# Patient Record
Sex: Female | Born: 1990 | Race: White | Hispanic: No | Marital: Single | Smoking: Never smoker
Health system: Southern US, Community
[De-identification: ages and names within clinical notes are randomized; demographics above are authoritative.]

## PROBLEM LIST (undated history)

## (undated) DIAGNOSIS — F419 Anxiety disorder, unspecified: Secondary | ICD-10-CM

## (undated) DIAGNOSIS — F988 Other specified behavioral and emotional disorders with onset usually occurring in childhood and adolescence: Secondary | ICD-10-CM

## (undated) DIAGNOSIS — Z8481 Family history of carrier of genetic disease: Secondary | ICD-10-CM

## (undated) DIAGNOSIS — B009 Herpesviral infection, unspecified: Secondary | ICD-10-CM

## (undated) DIAGNOSIS — F329 Major depressive disorder, single episode, unspecified: Secondary | ICD-10-CM

## (undated) DIAGNOSIS — Z803 Family history of malignant neoplasm of breast: Secondary | ICD-10-CM

## (undated) DIAGNOSIS — J45909 Unspecified asthma, uncomplicated: Secondary | ICD-10-CM

## (undated) DIAGNOSIS — G43019 Migraine without aura, intractable, without status migrainosus: Secondary | ICD-10-CM

## (undated) DIAGNOSIS — T7840XA Allergy, unspecified, initial encounter: Secondary | ICD-10-CM

## (undated) DIAGNOSIS — F32A Depression, unspecified: Secondary | ICD-10-CM

## (undated) DIAGNOSIS — K219 Gastro-esophageal reflux disease without esophagitis: Secondary | ICD-10-CM

## (undated) HISTORY — DX: Family history of malignant neoplasm of breast: Z80.3

## (undated) HISTORY — DX: Depression, unspecified: F32.A

## (undated) HISTORY — DX: Family history of carrier of genetic disease: Z84.81

## (undated) HISTORY — DX: Major depressive disorder, single episode, unspecified: F32.9

## (undated) HISTORY — PX: TONSILLECTOMY: SUR1361

## (undated) HISTORY — DX: Other specified behavioral and emotional disorders with onset usually occurring in childhood and adolescence: F98.8

## (undated) HISTORY — DX: Anxiety disorder, unspecified: F41.9

## (undated) HISTORY — DX: Herpesviral infection, unspecified: B00.9

## (undated) HISTORY — DX: Migraine without aura, intractable, without status migrainosus: G43.019

## (undated) HISTORY — PX: BREAST RECONSTRUCTION: SHX9

## (undated) HISTORY — PX: NIPPLE SPARING MASTECTOMY: SHX6537

## (undated) HISTORY — PX: OTHER SURGICAL HISTORY: SHX169

## (undated) HISTORY — DX: Allergy, unspecified, initial encounter: T78.40XA

## (undated) HISTORY — PX: WISDOM TOOTH EXTRACTION: SHX21

## (undated) HISTORY — DX: Unspecified asthma, uncomplicated: J45.909

## (undated) HISTORY — DX: Gastro-esophageal reflux disease without esophagitis: K21.9

---

## 2017-09-27 ENCOUNTER — Ambulatory Visit: Payer: BLUE CROSS/BLUE SHIELD | Admitting: Neurology

## 2017-09-27 ENCOUNTER — Encounter: Payer: Self-pay | Admitting: Neurology

## 2017-09-27 VITALS — BP 135/90 | HR 94 | Ht 66.0 in | Wt 194.0 lb

## 2017-09-27 DIAGNOSIS — R42 Dizziness and giddiness: Secondary | ICD-10-CM

## 2017-09-27 DIAGNOSIS — G43019 Migraine without aura, intractable, without status migrainosus: Secondary | ICD-10-CM | POA: Diagnosis not present

## 2017-09-27 DIAGNOSIS — H81399 Other peripheral vertigo, unspecified ear: Secondary | ICD-10-CM | POA: Diagnosis not present

## 2017-09-27 DIAGNOSIS — Z5181 Encounter for therapeutic drug level monitoring: Secondary | ICD-10-CM

## 2017-09-27 HISTORY — DX: Migraine without aura, intractable, without status migrainosus: G43.019

## 2017-09-27 MED ORDER — TOPIRAMATE 25 MG PO TABS: ORAL_TABLET | ORAL | 3 refills | Status: DC

## 2017-09-27 MED ORDER — PREDNISONE 5 MG PO TABS: ORAL_TABLET | ORAL | 0 refills | Status: DC

## 2017-09-27 NOTE — Patient Instructions (Signed)
   We will get MRI of the brain and start a 6 day course of prednisone, start Topamax for migraine.  Topamax (topiramate) is a seizure medication that has an FDA approval for seizures and for migraine headache. Potential side effects of this medication include weight loss, cognitive slowing, tingling in the fingers and toes, and carbonated drinks will taste bad. If any significant side effects are noted on this drug, please contact our office.

## 2017-09-27 NOTE — Progress Notes (Signed)
Reason for visit: Vertigo  Referring physician: Dr. Luevenia Maxin is a 27 y.o. female  History of present illness:  Ms. Bodie is a 27 year old right-handed white female with a history of migraine headache.  The patient typically gets 1 or 2 headaches a week in the frontal areas, sometimes they are in the occipital regions.  The patient may have photophobia and phonophobia with the headache.  The patient at times may have some dizziness as well associated with the migraine.  In early November 2018 the patient had a relatively sudden onset of vertigo that was associated with some nausea without vomiting.  The patient indicates that the vertigo has improved to some degree, but she remains dizzy on a daily basis.  She does not take meclizine for this.  She was seen through ENT and she was not found to have any evidence of vestibular dysfunction she claims.  The patient has not had any ear pain or change in hearing.  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  The patient over the last 6-8 weeks has developed some problems with concentration, she has word finding problems and she has had some clumsiness with the hands, she is dropping things frequently.  The patient does have some slight gait instability, but no falls with the dizziness.  She denies any double vision, slurred speech, or difficulty with swallowing.  She has had no visual loss.  She reports no numbness on the arms or legs with exception of some slight tingling in the hands at times.  The patient denies issues controlling the bowels or the bladder.  She has had blood work to include a hemoglobin A1c and a TSH that were unremarkable.  She is sent to this office for further evaluation.  She has not undergone MRI evaluation of the brain.  Past Medical History:  Diagnosis Date  . Anxiety   . Depression     Past Surgical History:  Procedure Laterality Date  . TONSILLECTOMY      Family History  Problem Relation Age of  Onset  . Migraines Father     Social history:  reports that  has never smoked. she has never used smokeless tobacco. She reports that she drinks alcohol. She reports that she does not use drugs.  Medications:  Prior to Admission medications   Medication Sig Start Date End Date Taking? Authorizing Provider  buPROPion (WELLBUTRIN XL) 150 MG 24 hr tablet Take 150 mg by mouth daily.   Yes [provider]     No Known Allergies  ROS:  Out of a complete 14 system review of symptoms, the patient complains only of the following symptoms, and all other reviewed systems are negative.  Ringing in the ears, dizziness Confusion, headache, weakness Depression, anxiety, decreased energy, disinterest in activities Insomnia  Blood pressure 135/90, pulse 94, height 5\' 6"  (1.676 m), weight 194 lb (88 kg).  Physical Exam  General: The patient is alert and cooperative at the time of the examination.  The patient is minimally obese.  Eyes: Pupils are equal, round, and reactive to light. Discs are flat bilaterally.  Ears: Tympanic membranes are partially obscured from cerumen, the thing that membranes appear to be clear.  Neck: The neck is supple, no carotid bruits are noted.  Respiratory: The respiratory examination is clear.  Cardiovascular: The cardiovascular examination reveals a regular rate and rhythm, no obvious murmurs or rubs are noted.  Skin: Extremities are without significant edema.  Neurologic Exam  Mental status: The patient is alert and oriented x 3 at the time of the examination. The patient has apparent normal recent and remote memory, with an apparently normal attention span and concentration ability.  Cranial nerves: Facial symmetry is present. There is good sensation of the face to pinprick and soft touch bilaterally. The strength of the facial muscles and the muscles to head turning and shoulder shrug are normal bilaterally. Speech is well enunciated, no aphasia or  dysarthria is noted. Extraocular movements are full. Visual fields are full. The tongue is midline, and the patient has symmetric elevation of the soft palate. No obvious hearing deficits are noted.  Motor: The motor testing reveals 5 over 5 strength of all 4 extremities. Good symmetric motor tone is noted throughout.  Sensory: Sensory testing is intact to pinprick, soft touch, vibration sensation, and position sense on all 4 extremities. No evidence of extinction is noted.  Coordination: Cerebellar testing reveals good finger-nose-finger and heel-to-shin bilaterally.  Gait and station: Gait is normal. Tandem gait is normal. Romberg is negative. No drift is seen.  Reflexes: Deep tendon reflexes are symmetric and normal bilaterally. Toes are downgoing bilaterally.   Assessment/Plan:  1.  Migraine headache  2.  Dizziness, vertigo  The patient does have a history of migraine headache, she has had some persistence of dizziness and vertigo since onset in early November.  She has developed some issues with concentration, and some problems with word finding.  We will check MRI of the brain, she will have blood work done today.  The patient may have migraine headache as an explanation for the dizziness and altered mental status.  She will be given a 6-day prednisone Dosepak, she will be started on Topamax.  She will follow-up in 3 months.  Marlan Palau. Keith Willis MD 09/27/2017 8:32 AM  [facility] [address] [place], [place] [id number]  Phone [phone] Fax [phone]

## 2017-09-28 ENCOUNTER — Telehealth: Payer: Self-pay | Admitting: *Deleted

## 2017-09-28 LAB — COMPREHENSIVE METABOLIC PANEL
ALK PHOS: 63 IU/L (ref 39–117)
ALT: 11 IU/L (ref 0–32)
AST: 17 IU/L (ref 0–40)
Albumin/Globulin Ratio: 1.8 (ref 1.2–2.2)
Albumin: 4.4 g/dL (ref 3.5–5.5)
BUN/Creatinine Ratio: 16 (ref 9–23)
BUN: 13 mg/dL (ref 6–20)
Bilirubin Total: 0.5 mg/dL (ref 0.0–1.2)
CALCIUM: 9.3 mg/dL (ref 8.7–10.2)
CO2: 20 mmol/L (ref 20–29)
CREATININE: 0.79 mg/dL (ref 0.57–1.00)
Chloride: 107 mmol/L — ABNORMAL HIGH (ref 96–106)
GFR calc Af Amer: 121 mL/min/{1.73_m2} (ref >59)
GFR, EST NON AFRICAN AMERICAN: 105 mL/min/{1.73_m2} (ref >59)
GLUCOSE: 92 mg/dL (ref 65–99)
Globulin, Total: 2.5 g/dL (ref 1.5–4.5)
Potassium: 4.1 mmol/L (ref 3.5–5.2)
Sodium: 143 mmol/L (ref 134–144)
Total Protein: 6.9 g/dL (ref 6.0–8.5)

## 2017-09-28 LAB — SEDIMENTATION RATE: SED RATE: 2 mm/h (ref 0–32)

## 2017-09-28 NOTE — Telephone Encounter (Signed)
-----   Message from York Spanielharles K Willis, MD sent at 09/28/2017  7:38 AM EST -----  The blood work results are unremarkable, with exception of a minimally elevated chloride level, not clinically significant. Please call the patient. ----- Message ----- From: Nell RangeInterface, [facility] Lab Results In Sent: 09/28/2017   5:42 AM To: York Spanielharles K Willis, MD

## 2017-09-28 NOTE — Telephone Encounter (Signed)
Called and spoke with patient about lab results per CW,MD note. Patient verbalized understanding.

## 2017-10-02 ENCOUNTER — Ambulatory Visit: Payer: BLUE CROSS/BLUE SHIELD

## 2017-10-02 DIAGNOSIS — G43019 Migraine without aura, intractable, without status migrainosus: Secondary | ICD-10-CM

## 2017-10-02 DIAGNOSIS — H81399 Other peripheral vertigo, unspecified ear: Secondary | ICD-10-CM | POA: Diagnosis not present

## 2017-10-03 ENCOUNTER — Telehealth: Payer: Self-pay | Admitting: Neurology

## 2017-10-03 MED ORDER — GADOPENTETATE DIMEGLUMINE 469.01 MG/ML IV SOLN: 18.0000 mL | Freq: Once | INTRAVENOUS | Status: DC | PRN

## 2017-10-03 NOTE — Telephone Encounter (Signed)
I called the patient.  The MRI of the brain is completely normal.  We will continue the treatment with Topamax to see if we can gain benefit with the headaches and the dizziness.   MRI brain 10/03/17:  IMPRESSION:   Normal MRI brain (with and without).

## 2017-10-10 ENCOUNTER — Telehealth: Payer: Self-pay | Admitting: Neurology

## 2017-10-10 NOTE — Telephone Encounter (Signed)
I called the patient, left a message.  The patient is having cognitive side effects on the Topamax, may need to try another medication, I will call back later.

## 2017-10-10 NOTE — Telephone Encounter (Signed)
Pt has called re: topiramate (TOPAMAX) 25 MG tablet she is experiencing cognitive slowness and her being a IT consultantgrad student and graduating in 2 months pt states this is unacceptable.  Please call

## 2017-10-11 MED ORDER — GABAPENTIN 100 MG PO CAPS: ORAL_CAPSULE | ORAL | 3 refills | Status: DC

## 2017-10-11 NOTE — Addendum Note (Signed)
Addended by: York SpanielWILLIS, CHARLES K on: 10/11/2017 07:58 AM   Modules accepted: Orders

## 2017-10-11 NOTE — Telephone Encounter (Signed)
I called the patient.  Patient cannot tolerate Topamax secondary to cognitive side effects.  She will discontinue the Topamax, she is only on 50 mg at night.  The patient has significant depression, cannot use propranolol, she is already on Wellbutrin.  The patient will be placed on low-dose gabapentin taking 100 mg in the morning, 200 mg in the evening, she will call for any dose adjustments.

## 2017-12-19 ENCOUNTER — Other Ambulatory Visit: Payer: Self-pay | Admitting: Neurology

## 2017-12-25 ENCOUNTER — Encounter: Payer: Self-pay | Admitting: Neurology

## 2017-12-25 ENCOUNTER — Ambulatory Visit: Payer: BLUE CROSS/BLUE SHIELD | Admitting: Neurology

## 2017-12-25 ENCOUNTER — Telehealth: Payer: Self-pay | Admitting: Neurology

## 2017-12-25 VITALS — BP 111/78 | HR 69 | Ht 66.0 in | Wt 193.5 lb

## 2017-12-25 DIAGNOSIS — G43019 Migraine without aura, intractable, without status migrainosus: Secondary | ICD-10-CM

## 2017-12-25 MED ORDER — VENLAFAXINE HCL ER 37.5 MG PO CP24
75.0000 mg | ORAL_CAPSULE | Freq: Every day | ORAL | 3 refills | Status: DC
Start: 2017-12-25 — End: 2018-01-17

## 2017-12-25 NOTE — Patient Instructions (Signed)
   We will start Effexor 37.5 mg tablet one a day for 2 weeks, then take 2 a day.

## 2017-12-25 NOTE — Telephone Encounter (Signed)
Noted, we will be happy to send records when she makes contact.

## 2017-12-25 NOTE — Telephone Encounter (Signed)
Patient states she is in school in [place] but she is from [place] and will not be in [place] for the 4 mo f/u w/ NP that Dr. Anne Hahn has requested. She states she will find another neurologist in [place].

## 2017-12-25 NOTE — Progress Notes (Signed)
Reason for visit: Migraine headache  Bethany Robinson is an 27 y.o. female  History of present illness:  Bethany Robinson is a 27 year old right-handed white female with a history of intractable migraine headache.  The patient had been experiencing episodes of vertigo, but she claims that this has not been as prominent recently.  The patient was placed on Topamax but could not tolerate the medication secondary to cognitive side effects.  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  The patient is under a lot of stress currently.  She went on gabapentin in very low-dose taking 100 mg in the morning and 200 mg in the evening, she has tolerated the medication well but she believes that this increased the frequency of her headache which is now occurring about every other day.  The patient has gained benefit with her depression, however.  The patient returns to this office for an evaluation.  The patient does not always sleep well at night.  She is having a lot of allergy symptoms but does not relate this to her headaches.  The patient claims that her father has migraine as well with hemiplegic features, she recently had an episode of right hand burning sensation that lasted about 1 hour associated with a headache.  This has not recurred.  Past Medical History:  Diagnosis Date  . Anxiety   . Common migraine with intractable migraine 09/27/2017  . Depression     Past Surgical History:  Procedure Laterality Date  . TONSILLECTOMY      Family History  Problem Relation Age of Onset  . Migraines Father     Social history:  reports that she has never smoked. She has never used smokeless tobacco. She reports that she drinks alcohol. She reports that she does not use drugs.    Allergies  Allergen Reactions  . Topamax [Topiramate]     Cognitive slowing    Medications:  Prior to Admission medications   Medication Sig Start Date End Date Taking? Authorizing  Provider  buPROPion (WELLBUTRIN XL) 150 MG 24 hr tablet Take 150 mg by mouth daily.   Yes [provider]  etonogestrel (NEXPLANON) 68 MG IMPL implant 1 each by Subdermal route once.   Yes [provider]  gabapentin (NEURONTIN) 100 MG capsule 1 capsule in the morning, 2 in the evening 10/11/17  Yes York Spaniel, MD    ROS:  Out of a complete 14 system review of symptoms, the patient complains only of the following symptoms, and all other reviewed systems are negative.  Ringing in the ears Palpitations of the heart Insomnia, sleep apnea Aching muscles Headache, numbness Depression, anxiety  Blood pressure 111/78, pulse 69, height  (1.676 m), weight 193 lb 8 oz (87.8 kg).  Physical Exam  General: The patient is alert and cooperative at the time of the examination.  The patient is moderately obese.  Skin: No significant peripheral edema is noted.   Neurologic Exam  Mental status: The patient is alert and oriented x 3 at the time of the examination. The patient has apparent normal recent and remote memory, with an apparently normal attention span and concentration ability.   Cranial nerves: Facial symmetry is present. Speech is normal, no aphasia or dysarthria is noted. Extraocular movements are full. Visual fields are full.  Motor: The patient has good strength in all 4 extremities.  Sensory examination: Soft touch sensation is symmetric on the face, arms,  and legs.  Coordination: The patient has good finger-nose-finger and heel-to-shin bilaterally.  Gait and station: The patient has a normal gait. Tandem gait is normal. Romberg is negative. No drift is seen.  Reflexes: Deep tendon reflexes are symmetric.   Assessment/Plan:  1.  Migraine headache  2.  Episodic vertigo  More recently the vertigo has not been much of a problem, but the headaches are more frequent.  The patient indicates that her headaches are in the right periorbital area and  in the occipital area.  The patient has had an increase in frequency of the headache on gabapentin, this will be discontinued and Effexor will be started taking 37.5 mg daily for 2 weeks and then taking 75 mg daily.  She will call for any dose adjustments.  She will follow-up in 4 months.  Marlan Palau MD 12/25/2017 9:34 AM  [facility] [address] [place], [place] [address]  Phone [phone] Fax [phone]

## 2018-01-01 ENCOUNTER — Other Ambulatory Visit: Payer: Self-pay | Admitting: Neurology

## 2018-01-17 ENCOUNTER — Other Ambulatory Visit: Payer: Self-pay | Admitting: Neurology

## 2018-02-27 ENCOUNTER — Other Ambulatory Visit: Payer: Self-pay | Admitting: Neurology

## 2018-06-25 ENCOUNTER — Encounter: Payer: Self-pay | Admitting: Genetic Counselor

## 2018-06-25 ENCOUNTER — Telehealth: Payer: Self-pay | Admitting: Genetic Counselor

## 2018-06-25 NOTE — Telephone Encounter (Signed)
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 Pt has been scheduled to see Roma Kayser on 11/20 at 9am.Pt aware to arrive 15 minutes early. Letter mailed.

## 2018-07-17 ENCOUNTER — Encounter: Payer: Self-pay | Admitting: Genetic Counselor

## 2018-07-17 ENCOUNTER — Other Ambulatory Visit: Payer: Self-pay

## 2018-07-27 ENCOUNTER — Ambulatory Visit (INDEPENDENT_AMBULATORY_CARE_PROVIDER_SITE_OTHER): Payer: Worker's Compensation

## 2018-07-27 ENCOUNTER — Encounter (HOSPITAL_COMMUNITY): Payer: Self-pay | Admitting: *Deleted

## 2018-07-27 ENCOUNTER — Ambulatory Visit (HOSPITAL_COMMUNITY)
Admission: EM | Admit: 2018-07-27 | Discharge: 2018-07-27 | Disposition: A | Discharge status: Home / Self Care | Payer: Worker's Compensation

## 2018-07-27 DIAGNOSIS — S93401A Sprain of unspecified ligament of right ankle, initial encounter: Secondary | ICD-10-CM | POA: Diagnosis not present

## 2018-07-27 MED ORDER — NAPROXEN 500 MG PO TABS: 500.0000 mg | ORAL_TABLET | Freq: Two times a day (BID) | ORAL | 0 refills | Status: DC

## 2018-07-27 NOTE — Discharge Instructions (Addendum)
Elevate effected extremity, rest and ice

## 2018-07-27 NOTE — ED Triage Notes (Signed)
Reports working at [employer] today when she rolled her right ankle.  C/O continued discomfort.

## 2018-07-27 NOTE — ED Provider Notes (Signed)
[facility]    CSN: [id number] Arrival date & time: 07/27/18  1524     History   Chief Complaint Chief Complaint  Patient presents with  . Ankle Injury    HPI Bethany Robinson is a 27 y.o. female.   27 year old female presents with injuries to right ankle x4-1/2 hours.  Patient states she was outside fixing [employer] book a drop when she twisted her ankle.  Patient reports she initially felt like vomiting to the severity of the pain patient is able to ambulate and is full range of motion.  Condition is made worse with weightbearing activity.  Condition made better by nothing.  Patient denies any treatment prior to arrival at this facility.  Patient states that her knee has skin down to her but denies any additional injuries including loss of consciousness.     Past Medical History:  Diagnosis Date  . Anxiety   . Common migraine with intractable migraine 09/27/2017  . Depression     Patient Active Problem List   Diagnosis Date Noted  . Common migraine with intractable migraine 09/27/2017    Past Surgical History:  Procedure Laterality Date  . TONSILLECTOMY      OB History   None      Home Medications    Prior to Admission medications   Medication Sig Start Date End Date Taking? Authorizing Provider  buPROPion (WELLBUTRIN XL) 150 MG 24 hr tablet Take 150 mg by mouth daily.   Yes [provider]  Cetirizine HCl (ZYRTEC PO) Take by mouth.   Yes [provider]  CLONIDINE HCL PO Take by mouth.   Yes [provider]  etonogestrel (NEXPLANON) 68 MG IMPL implant 1 each by Subdermal route once.   Yes [provider]  venlafaxine XR (EFFEXOR-XR) 37.5 MG 24 hr capsule TAKE 2 CAPSULES BY MOUTH DAILY WITH BREAKFAST. 01/17/18  Yes York SpanielWillis, Charles K, MD    Family History Family History  Problem Relation Age of Onset  . Migraines Father     Social History Social History   Tobacco Use  . Smoking status: Never Smoker  .  Smokeless tobacco: Never Used  Substance Use Topics  . Alcohol use: Yes    Comment: socially  . Drug use: No     Allergies   Topamax [topiramate]   Review of Systems Review of Systems  Musculoskeletal: Positive for myalgias ( right ankle).     Physical Exam Triage Vital Signs ED Triage Vitals  Enc Vitals Group     BP 07/27/18 1625 133/90     Pulse Rate 07/27/18 1623 92     Resp 07/27/18 1623 16     Temp 07/27/18 1623 98.3 F (36.8 C)     Temp Source 07/27/18 1623 Oral     SpO2 07/27/18 1623 98 %     Weight --      Height --      Head Circumference --      Peak Flow --      Pain Score 07/27/18 1623 2     Pain Loc --      Pain Edu? --      Excl. in GC? --    No data found.  Updated Vital Signs BP 133/90   Pulse 92   Temp 98.3 F (36.8 C) (Oral)   Resp 16   SpO2 98%   Visual Acuity Right Eye Distance:   Left Eye Distance:   Bilateral Distance:    Right  Eye Near:   Left Eye Near:    Bilateral Near:     Physical Exam  Constitutional: She is oriented to person, place, and time. She appears well-developed and well-nourished.  HENT:  Head: Normocephalic and atraumatic.  Eyes: Conjunctivae are normal.  Neck: Normal range of motion.  Pulmonary/Chest: Effort normal.  Musculoskeletal: Normal range of motion. She exhibits no edema, tenderness or deformity.  Right ankle, cap refill < 2 foot warm with good pulses  Neurological: She is alert and oriented to person, place, and time.  Psychiatric: She has a normal mood and affect.  Nursing note and vitals reviewed.    UC Treatments / Results  Labs (all labs ordered are listed, but only abnormal results are displayed) Labs Reviewed - No data to display  EKG None  Radiology No results found.  Procedures Procedures (including critical care time)  Medications Ordered in UC Medications - No data to display  Initial Impression / Assessment and Plan / UC Course  I have reviewed the triage vital signs  and the nursing notes.  Pertinent labs & imaging results that were available during my care of the patient were reviewed by me and considered in my medical decision making (see chart for details).      Final Clinical Impressions(s) / UC Diagnoses   Final diagnoses:  None   Discharge Instructions   None    ED Prescriptions    None     Controlled Substance Prescriptions [place] Controlled Substance Registry consulted? Not Applicable   Alene Mires, NP 07/27/18 1700

## 2018-07-29 ENCOUNTER — Inpatient Hospital Stay: Payer: BLUE CROSS/BLUE SHIELD | Attending: Genetic Counselor | Admitting: Genetic Counselor

## 2018-07-29 ENCOUNTER — Inpatient Hospital Stay: Payer: BLUE CROSS/BLUE SHIELD

## 2018-07-29 ENCOUNTER — Encounter: Payer: Self-pay | Admitting: Genetic Counselor

## 2018-07-29 DIAGNOSIS — Z1379 Encounter for other screening for genetic and chromosomal anomalies: Secondary | ICD-10-CM

## 2018-07-29 DIAGNOSIS — Z8481 Family history of carrier of genetic disease: Secondary | ICD-10-CM

## 2018-07-29 DIAGNOSIS — Z803 Family history of malignant neoplasm of breast: Secondary | ICD-10-CM

## 2018-07-29 NOTE — Progress Notes (Signed)
REFERRING PROVIDER: Avel Sensor, MD [address], [place] [address]  PRIMARY PROVIDER:  Patient, No Pcp Per  PRIMARY REASON FOR VISIT:  1. Family history of breast cancer   2. Family history of BRCA2 gene positive      HISTORY OF PRESENT ILLNESS:   Bethany Robinson, a 27 y.o. female, was seen for a [facility] cancer genetics consultation at the request of Dr. Carlota Raspberry due to a family history of cancer.  Bethany Robinson presents to clinic today to discuss the possibility of a hereditary predisposition to cancer, genetic testing, and to further clarify her future cancer risks, as well as potential cancer risks for family members.   Bethany Robinson is a 27 y.o. female with no personal history of cancer.  Her mother reportedly has a BRCA2 mutation, and she is wanting to get tested for this mutation.  CANCER HISTORY:   No history exists.     HORMONAL RISK FACTORS:  Menarche was at age 35.  First live birth at age N/A.  OCP use for approximately 0 years.  Ovaries intact: yes.  Hysterectomy: no.  Menopausal status: premenopausal.  HRT use: 0 years. Colonoscopy: no; not examined. Mammogram within the last year: no. Number of breast biopsies: 0. Up to date with pelvic exams:  yes. Any excessive radiation exposure in the past:  no  Past Medical History:  Diagnosis Date  . Anxiety   . Common migraine with intractable migraine 09/27/2017  . Depression   . Family history of BRCA2 gene positive   . Family history of breast cancer     Past Surgical History:  Procedure Laterality Date  . TONSILLECTOMY      Social History   Socioeconomic History  . Marital status: Single    Spouse name: Not on file  . Number of children: Not on file  . Years of education: Not on file  . Highest education level: Not on file  Occupational History  . Not on file  Social Needs  . Financial resource strain: Not on file  . Food insecurity:    Worry: Not on file    Inability: Not on  file  . Transportation needs:    Medical: Not on file    Non-medical: Not on file  Tobacco Use  . Smoking status: Never Smoker  . Smokeless tobacco: Never Used  Substance and Sexual Activity  . Alcohol use: Yes    Comment: socially  . Drug use: No  . Sexual activity: Not on file  Lifestyle  . Physical activity:    Days per week: Not on file    Minutes per session: Not on file  . Stress: Not on file  Relationships  . Social connections:    Talks on phone: Not on file    Gets together: Not on file    Attends religious service: Not on file    Active member of club or organization: Not on file    Attends meetings of clubs or organizations: Not on file    Relationship status: Not on file  Other Topics Concern  . Not on file  Social History Narrative  . Not on file     FAMILY HISTORY:  We obtained a detailed, 4-generation family history.  Significant diagnoses are listed below: Family History  Problem Relation Age of Onset  . Migraines Father   . BRCA 1/2 Mother        BRCA2 pos  . BRCA 1/2 Maternal Aunt  BRCA2 pos  . Breast cancer Maternal Grandmother     The patient does not have children.  She has two sisters and a brother.  One sister is undergoing genetic testing next week.  Both parents are living.  The patient's father is healthy.  He is one of 7 siblings.  None of his siblings reportedly have cancer.  Both paternal grandparents are deceased from non-cancer related issues.  The patient's mother reportedly tested positive for a BRCA2 mutation about 10 years ago.  She has had a double mastectomy and a TAH-BSO.  She is one of 12 children.  One sister had tested positive for the BRCA2 mutation.  The maternal grandfather is living, the grandmother had breast cancer and died.  Bethany Robinson is aware of previous family history of genetic testing for hereditary cancer risks. Patient's maternal ancestors are of [place] descent, and paternal ancestors are of [place]  descent. There is no reported Ashkenazi Jewish ancestry. There is no known consanguinity.  GENETIC COUNSELING ASSESSMENT: Bethany Robinson is a 27 y.o. female with a family history of breast cancer and a known family mutation in BRCA2 which is somewhat suggestive of a hereditary cancer syndrome and predisposition to cancer. We, therefore, discussed and recommended the following at today's visit.   DISCUSSION: We discussed that about 5-10% of breast cancer is hereditary with most cases due to BRCA mutations.  We discussed that there are other genes that can increase the risk for breast cancer, which her mother would not have been tested for about 10-11 years ago.  About 6% of patients with one hereditary mutation have a second mutation.  She reports that her family was followed by Alfredia Client and participated in some of his studies.  Since many of his studies were about Lynch syndrome (although I admittedly do not know what all he was involved in), I wonder if we should consider additional testing.  The patient is at 50% risk for having a hereditary gene mutation in BRCA2.  We asked to get a copy of her mother's genetic testing.  We discussed that having that report will help ensure that her mother had a mutation rather than a VUS, and it will confirm the actual gene that the mutation is in (although 10 years ago we were really only testing for BRCA1 and BRCA2).  She will try to get a copy of the report.  We reviewed the characteristics, features and inheritance patterns of hereditary cancer syndromes. We also discussed genetic testing, including the appropriate family members to test, the process of testing, insurance coverage and turn-around-time for results. We discussed the implications of a negative, positive and/or variant of uncertain significant result. In order to get genetic test results in a timely manner so that Bethany Robinson can use these genetic test results for surgical decisions, we recommended  Bethany Robinson pursue genetic testing for the BRCA1 and BRCA2 genes. If this test is negative, we then recommend Bethany Robinson pursue reflex genetic testing to the common hereditary cancer gene panel. The Hereditary Gene Panel offered by [facility] includes sequencing and/or deletion duplication testing of the following 47 genes: APC, ATM, AXIN2, BARD1, BMPR1A, BRCA1, BRCA2, BRIP1, CDH1, CDK4, CDKN2A (p14ARF), CDKN2A (p16INK4a), CHEK2, CTNNA1, DICER1, EPCAM (Deletion/duplication testing only), GREM1 (promoter region deletion/duplication testing only), KIT, MEN1, MLH1, MSH2, MSH3, MSH6, MUTYH, NBN, NF1, NHTL1, PALB2, PDGFRA, PMS2, POLD1, POLE, PTEN, RAD50, RAD51C, RAD51D, SDHB, SDHC, SDHD, SMAD4, SMARCA4. STK11, TP53, TSC1, TSC2, and VHL.  The following genes were evaluated for  sequence changes only: SDHA and HOXB13 c.251G>A variant only.   Based on Bethany Robinson's personal and family history of cancer, she meets medical criteria for genetic testing. Despite that she meets criteria, she may still have an out of pocket cost. We discussed that if her out of pocket cost for testing is over $100, the laboratory will call and confirm whether she wants to proceed with testing.  If the out of pocket cost of testing is less than $100 she will be billed by the genetic testing laboratory.   PLAN: After considering the risks, benefits, and limitations, Bethany Robinson  provided informed consent to pursue genetic testing and the blood sample was sent to [facility] for analysis of the BRCA1 and BRCA2 gene panel, reflexing to a larger gene panel. Results should be available within approximately 2-3 weeks' time, at which point they will be disclosed by telephone to Bethany Robinson, as will any additional recommendations warranted by these results. Bethany Robinson will receive a summary of her genetic counseling visit and a copy of her results once available. This information will also be available in Epic. We encouraged Bethany Robinson  to remain in contact with cancer genetics annually so that we can continuously update the family history and inform her of any changes in cancer genetics and testing that may be of benefit for her family. Bethany Robinson's questions were answered to her satisfaction today. Our contact information was provided should additional questions or concerns arise.  Lastly, we encouraged Bethany Robinson to remain in contact with cancer genetics annually so that we can continuously update the family history and inform her of any changes in cancer genetics and testing that may be of benefit for this family.   Ms.  Robinson's questions were answered to her satisfaction today. Our contact information was provided should additional questions or concerns arise. Thank you for the referral and allowing [place] to share in the care of your patient.   Karen P. Florene Glen, [place], [facility] Certified Genetic Counselor [email or web address] phone: [phone]  The patient was seen for a total of 35 minutes in face-to-face genetic counseling.  This patient was discussed with Drs. Magrinat, Lindi Adie and/or Burr Medico who agrees with the above.    _______________________________________________________________________ For Office Staff:  Number of people involved in session: 1 Was an Intern/ student involved with case: no

## 2018-08-08 ENCOUNTER — Encounter: Payer: Self-pay | Admitting: Genetic Counselor

## 2018-08-08 DIAGNOSIS — Z1379 Encounter for other screening for genetic and chromosomal anomalies: Secondary | ICD-10-CM | POA: Insufficient documentation

## 2018-08-09 ENCOUNTER — Telehealth: Payer: Self-pay | Admitting: Genetic Counselor

## 2018-08-09 ENCOUNTER — Encounter: Payer: Self-pay | Admitting: Genetic Counselor

## 2018-08-09 DIAGNOSIS — Z1501 Genetic susceptibility to malignant neoplasm of breast: Secondary | ICD-10-CM | POA: Insufficient documentation

## 2018-08-09 DIAGNOSIS — Z1509 Genetic susceptibility to other malignant neoplasm: Secondary | ICD-10-CM

## 2018-08-09 NOTE — Telephone Encounter (Signed)
LM on VM that results were back and to please cb.

## 2018-08-09 NOTE — Telephone Encounter (Signed)
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  She will come back for genetic counseling next week.

## 2018-08-15 ENCOUNTER — Inpatient Hospital Stay (HOSPITAL_BASED_OUTPATIENT_CLINIC_OR_DEPARTMENT_OTHER): Payer: BLUE CROSS/BLUE SHIELD | Admitting: Genetic Counselor

## 2018-08-15 DIAGNOSIS — Z1501 Genetic susceptibility to malignant neoplasm of breast: Secondary | ICD-10-CM

## 2018-08-15 DIAGNOSIS — Z1509 Genetic susceptibility to other malignant neoplasm: Secondary | ICD-10-CM

## 2018-08-15 DIAGNOSIS — Z1379 Encounter for other screening for genetic and chromosomal anomalies: Secondary | ICD-10-CM

## 2018-08-15 NOTE — Progress Notes (Signed)
GENETIC TEST RESULTS   Patient Name: Bethany Robinson Patient Age: 27 y.o. Encounter Date: 08/15/2018  Referring Provider: Huntley Estelle, MD    Ms. Seder was seen in the [facility] on August 15, 2018 due to a family history of cancer, positive BRCA2 genetic testing, and concern regarding a hereditary predisposition to cancer in the family. Please refer to the prior Genetics clinic note for more information regarding Ms. Noxon's medical and family histories and our assessment at the time.   FAMILY HISTORY:  We obtained a detailed, 4-generation family history.  Significant diagnoses are listed below: Family History  Problem Relation Age of Onset  . Migraines Father   . BRCA 1/2 Mother        BRCA2 pos  . BRCA 1/2 Maternal Aunt        BRCA2 pos  . Breast cancer Maternal Grandmother     The patient does not have children.  She has two sisters and a brother.  One sister is undergoing genetic testing next week.  Both parents are living.  The patient's father is healthy.  He is one of 7 siblings.  None of his siblings reportedly have cancer.  Both paternal grandparents are deceased from non-cancer related issues.  The patient's mother reportedly tested positive for a BRCA2 mutation about 10 years ago.  She has had a double mastectomy and a TAH-BSO.  She is one of 12 children.  One sister had tested positive for the BRCA2 mutation.  The maternal grandfather is living, the grandmother had breast cancer and died.  Ms. Espinoza is aware of previous family history of genetic testing for hereditary cancer risks. Patient's maternal ancestors are of [place] descent, and paternal ancestors are of [place] descent. There is no reported Ashkenazi Jewish ancestry. There is no known consanguinity.  GENETIC TESTING:  At the time of Ms. Ratto's visit, we recommended she pursue genetic testing of the BRCA1/BRCA2 genetic test, with a reflex to the common hereditary cancer panel. 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 This gene is associated with autosomal dominant Hereditary Breast and Ovarian Cancer Syndromeand autosomal recessive Fanconi Anemia. There were no deleterious mutations in APC, ATM, AXIN2, BARD1, BMPR1A, BRCA1, BRIP1, CDH1, CDK4, CDKN2A, CHEK2, CTNNA1, DICER1, EPCAM, GREM1, HOXB13, KIT, MEN1, MLH1, MSH2, MSH3, MSH6, MUTYH, NBN, NF1, NTHL1, PALB2, PDGFRA, PMS2, POLD1, POLE, PTEN, RAD50, RAD51C, RAD51D, SDHA, SDHB, SDHC, SDHD, SMAD4, SMARCA4. STK11, TP53, TSC1, TSC2, and VHL .  MEDICAL MANAGEMENT: Women who have a BRCA mutation have an increased risk for both breast and ovarian cancer.   As discussed with Ms. Ramella, to reduce the risk for breast cancer, prophylactic bilateral mastectomy is the most effective option for risk reduction. However, for women who choose to keep their breasts intensified screening is equally safe.  We recommend yearly mammograms, yearly breast MRI, twice-yearly clinical breast exams, and monthly self-breast exams. Ms. Bardsley will be seen in our high risk clinic to discuss surgical options.  To reduce the risk for ovarian cancer, we recommend Ms. Andis have a prophylactic bilateral salpingo-oophorectomy when childbearing is completed, if planned. We discussed that screening with CA-125 blood tests and transvaginal ultrasounds can be done twice per year. However, these tests have not been shown to detect ovarian cancer at an early stage.  Ms. Justo does not have a GYN MD.  She will need to discuss GYN referrals with the High Risk clinic.  RISK REDUCTION: There are several things that can be  offered to individuals who are carriers for BRCA mutations that will reduce the risk for getting cancer.   The use of oral contraceptives can lower the risk for ovarian cancer, and, per case control studies, does not significantly increase the risk for breast cancer in BRCA patients. Case  control studies have shown that oral contraceptives can lower the risk for ovarian cancer in women with BRCA mutations. Additionally, a more recent meta-analysis, including one cohort (n=3,181) and one case control study (1,096 cases and 2,878 controls) also showed an inverse correlation between ovarian cancer and ever having used oral contraceptives (OR, 0.58; 95% CI = 0.46-0.73). Studies on oral contraceptives and breast cancer have been conflicting, with some studies suggesting that there is not an increased risk for breast cancer in BRCA mutation carriers, while others suggest that there could be a risk. That said, two meta-analysis studies have shown that there is not an increased risk for breast cancer with oral contraceptive use in BRCA1 and BRCA2 carriers.   In individuals who have a prophylactic bilateral salpingo-oophorectomy (BSO), the risk for breast cancer is reduced by up to 50%. It has been reported that short term hormone replacement therapy in women undergoing prophylactic BSO does not negate the reduction of breast cancer risk associated with surgery (1.2018 NCCN guidelines).  FAMILY MEMBERS: It is important that all of Ms. Binford's relatives (both men and women) know of the presence of this gene mutation. Site-specific genetic testing can sort out who in the family is at risk and who is not.   Ms. Leske's siblings have a 50% chance to have inherited this mutation. We recommend they have genetic testing for this same mutation, as identifying the presence of this mutation would allow them to also take advantage of risk-reducing measures. Based on the testing lab, [facility]'s policy, they will test any family member for up to 21 days after her test report for free.  Ms. Lehmkuhl indicated that she will notify her relatives of this opportunity.  Individuals with a single pathogenic BRCA2 variant are also carriers of autosomal recessive Fanconi anemia. Fanconi anemia is characterized by  bone marrow failure with variable additional anomalies, which often include short stature, abnormal skin pigmentation, abnormal thumbs, malformations of the skeletal and central nervous systems, and developmental delay (PMID: 9166060, 04599774). Risk of leukemia and early-onset solid tumors is significantly elevated with this disorder (PMID: 14239532, 02334356, 86168372). For there to be a risk of Fanconi anemia in offspring, both the patient and their partner would each have to carry a pathogenic variant in BRCA2; in this case, the risk to have an affected child is 25%.  SUPPORT AND RESOURCES: If Ms. Corron is interested in BRCA-specific information and support, there are two groups, Facing Our Risk (www.facingourrisk.com) and Bright Pink (www.brightpink.org) which some people have found useful. They provide opportunities to speak with other individuals from high-risk families. To locate genetic counselors in other cities, visit the website of the [employer] (ArtistMovie.se) and Secretary/administrator for a Social worker by zip code.  We encouraged Ms. Giovannini to remain in contact with [place] on an annual basis so we can update her personal and family histories, and let her know of advances in cancer genetics that may benefit the family. Our contact number was provided. Ms. Saxton's questions were answered to her satisfaction today, and she knows she is welcome to call anytime with additional questions.   Bary Limbach P. Florene Glen, [place], [facility] Certified Genetic Counselor [email or web address] phone: [phone]

## 2018-08-19 ENCOUNTER — Telehealth: Payer: Self-pay | Admitting: Genetic Counselor

## 2018-08-19 NOTE — Telephone Encounter (Signed)
lft the pt a vm to schedule an appt for the high risk breast clinic °

## 2018-08-28 ENCOUNTER — Emergency Department (HOSPITAL_COMMUNITY): Payer: BLUE CROSS/BLUE SHIELD

## 2018-08-28 ENCOUNTER — Emergency Department (HOSPITAL_COMMUNITY)
Admission: EM | Admit: 2018-08-28 | Discharge: 2018-08-28 | Disposition: A | Discharge status: Home / Self Care | Payer: BLUE CROSS/BLUE SHIELD | Attending: Emergency Medicine | Admitting: Emergency Medicine

## 2018-08-28 ENCOUNTER — Other Ambulatory Visit: Payer: Self-pay

## 2018-08-28 ENCOUNTER — Encounter (HOSPITAL_COMMUNITY): Payer: Self-pay | Admitting: Emergency Medicine

## 2018-08-28 DIAGNOSIS — Z79899 Other long term (current) drug therapy: Secondary | ICD-10-CM | POA: Insufficient documentation

## 2018-08-28 DIAGNOSIS — R1011 Right upper quadrant pain: Secondary | ICD-10-CM | POA: Insufficient documentation

## 2018-08-28 DIAGNOSIS — R030 Elevated blood-pressure reading, without diagnosis of hypertension: Secondary | ICD-10-CM | POA: Insufficient documentation

## 2018-08-28 LAB — URINALYSIS, ROUTINE W REFLEX MICROSCOPIC
Bilirubin Urine: NEGATIVE
Glucose, UA: NEGATIVE mg/dL
Ketones, ur: 20 mg/dL — AB
Leukocytes, UA: NEGATIVE
Nitrite: NEGATIVE
Protein, ur: NEGATIVE mg/dL
Specific Gravity, Urine: 1.002 — ABNORMAL LOW (ref 1.005–1.030)
pH: 7 (ref 5.0–8.0)

## 2018-08-28 LAB — CBC
HCT: 45.5 % (ref 36.0–46.0)
Hemoglobin: 14.8 g/dL (ref 12.0–15.0)
MCH: 30.3 pg (ref 26.0–34.0)
MCHC: 32.5 g/dL (ref 30.0–36.0)
MCV: 93 fL (ref 80.0–100.0)
PLATELETS: 248 10*3/uL (ref 150–400)
RBC: 4.89 MIL/uL (ref 3.87–5.11)
RDW: 12.4 % (ref 11.5–15.5)
WBC: 8.9 10*3/uL (ref 4.0–10.5)
nRBC: 0 % (ref 0.0–0.2)

## 2018-08-28 LAB — COMPREHENSIVE METABOLIC PANEL
ALT: 19 U/L (ref 0–44)
AST: 19 U/L (ref 15–41)
Albumin: 4.2 g/dL (ref 3.5–5.0)
Alkaline Phosphatase: 42 U/L (ref 38–126)
Anion gap: 11 (ref 5–15)
BUN: 10 mg/dL (ref 6–20)
CALCIUM: 9 mg/dL (ref 8.9–10.3)
CO2: 23 mmol/L (ref 22–32)
CREATININE: 0.77 mg/dL (ref 0.44–1.00)
Chloride: 105 mmol/L (ref 98–111)
GFR calc Af Amer: >60 mL/min (ref >60)
GFR calc non Af Amer: >60 mL/min (ref >60)
Glucose, Bld: 94 mg/dL (ref 70–99)
Potassium: 3.9 mmol/L (ref 3.5–5.1)
Sodium: 139 mmol/L (ref 135–145)
Total Bilirubin: 0.3 mg/dL (ref 0.3–1.2)
Total Protein: 7.4 g/dL (ref 6.5–8.1)

## 2018-08-28 LAB — I-STAT BETA HCG BLOOD, ED (MC, WL, AP ONLY): I-stat hCG, quantitative: <5 m[IU]/mL (ref <5)

## 2018-08-28 LAB — LIPASE, BLOOD: Lipase: 44 U/L (ref 11–51)

## 2018-08-28 MED ORDER — SODIUM CHLORIDE (PF) 0.9 % IJ SOLN
INTRAMUSCULAR | Status: AC
  Filled 2018-08-28: qty 50

## 2018-08-28 MED ORDER — SODIUM CHLORIDE 0.9 % IV BOLUS
1000.0000 mL | Freq: Once | INTRAVENOUS | Status: AC
  Administered 2018-08-28: 1000 mL via INTRAVENOUS

## 2018-08-28 MED ORDER — IOPAMIDOL (ISOVUE-300) INJECTION 61%
INTRAVENOUS | Status: AC
  Filled 2018-08-28: qty 100

## 2018-08-28 MED ORDER — IOPAMIDOL (ISOVUE-300) INJECTION 61%
100.0000 mL | Freq: Once | INTRAVENOUS | Status: AC | PRN
  Administered 2018-08-28: 100 mL via INTRAVENOUS

## 2018-08-28 NOTE — ED Triage Notes (Signed)
Pt complaint of right abd pain radiating to her back; onset for a week but acute worsening yesterday; nausea associated.

## 2018-08-28 NOTE — Discharge Instructions (Signed)
You were evaluated today for abdominal pain.  Your lab work, urinalysis, ultrasound and CT scan were negative.  Your pain is most likely due to constipation.  You did not want an enema while you are in need emergency department.  I recommend continue with your home bowel regimen.  Make sure to increase your fluid intake to soften your stools.  You may also take MiraLAX over-the-counter as well as stool softeners.  You also had elevated blood pressure in the department.  Please follow-up with PCP for reevaluation.  Return to the ED for any new or worsening symptoms.

## 2018-08-28 NOTE — ED Provider Notes (Signed)
[facility] DEPT Provider Note   CSN: [id number] Arrival date & time: 08/28/18  1032   History   Chief Complaint Chief Complaint  Patient presents with  . Abdominal Pain    HPI Bethany Robinson is a 28 y.o. female with a past medical history significant for depression, anxiety, chronic migraine who presents for evaluation of abdominal pain.  Patient states she has had right upper quadrant and epigastric abdominal pain x1 week.  States pain is intermittent in nature, worse with no intake.  Patient states she thought her pain had dissipated, however returned 2 days ago.  Describes her pain as a dull aching as well as a sharp stabbing sensation.  States her pain is mainly located to the right upper quadrant and radiates to her back.  Rates her pain a 2/10 on initial evaluation, however patient states approximately 2 hours ago in the waiting room her pain was a 10/10.  Has tried taking Tylenol and ibuprofen without relief of her symptoms.  Patient states she has also had constipation over the last week.  Last bowel movement approximately 5 days ago.  States she is passing gas. Has bowel movement every 4 days approximately. Admits to history of constipation, however states she has not gone "this long since have had a bowel movement."  Patient states approximately 1 week ago she did use an at-home laxative which produced a bowel movement. Has not had a bowel movement since.  Patient was seen by Urgent care and recommended ED evaluation for possible cholecystitis. Has had intermittent nausea and vomiting.  Last emesis 3 days ago.  Emesis has been nonbilious and nonbloody.  Denies fever, chills, headache, chest pain, shortness of breath, cough, dysuria, pelvic pain, vaginal discharge.  Has been able to tolerate p.o. intake without difficulty.  Denies previous history of abdominal surgeries.  Last p.o. intake yesterday at 5 PM.  History obtained from patient. No interpreter was  used  HPI  Past Medical History:  Diagnosis Date  . Anxiety   . Common migraine with intractable migraine 09/27/2017  . Depression   . Family history of BRCA2 gene positive   . Family history of breast cancer     Patient Active Problem List   Diagnosis Date Noted  . 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 Genetic testing 08/08/2018  . Family history of breast cancer   . Family history of BRCA2 gene positive   . Common migraine with intractable migraine 09/27/2017    Past Surgical History:  Procedure Laterality Date  . TONSILLECTOMY       OB History   No obstetric history on file.      Home Medications    Prior to Admission medications   Medication Sig Start Date End Date Taking? Authorizing Provider  BuPROPion HBr (APLENZIN) 348 MG TB24 Take 348 mg by mouth daily.   Yes [provider]  Cetirizine HCl (ZYRTEC PO) Take 10 mg by mouth daily.    Yes [provider]  cloNIDine (CATAPRES) 0.1 MG tablet Take 0.1 mg by mouth daily.    Yes [provider]  etonogestrel (NEXPLANON) 68 MG IMPL implant 1 each by Subdermal route once.   Yes [provider]  Norgestimate-Ethinyl Estradiol Triphasic (TRI-PREVIFEM) 0.18/0.215/0.25 MG-35 MCG tablet Take 1 tablet by mouth daily.   Yes [provider]  venlafaxine XR (EFFEXOR-XR) 37.5 MG 24 hr capsule TAKE 2 CAPSULES BY MOUTH DAILY WITH BREAKFAST. Patient taking differently: Take 37.5 mg by mouth daily  with breakfast.  01/17/18  Yes Kathrynn Ducking, MD  naproxen (NAPROSYN) 500 MG tablet Take 1 tablet (500 mg total) by mouth 2 (two) times daily. Patient not taking: Reported on 08/28/2018 07/27/18   Jacqualine Mau, NP    Family History Family History  Problem Relation Age of Onset  . Migraines Father   . BRCA 1/2 Mother        BRCA2 pos  . BRCA 1/2 Maternal Aunt        BRCA2 pos  . Breast cancer Maternal Grandmother     Social History Social History   Tobacco Use  .  Smoking status: Never Smoker  . Smokeless tobacco: Never Used  Substance Use Topics  . Alcohol use: Yes    Comment: socially  . Drug use: No     Allergies   Hydrocodone and Topamax [topiramate]   Review of Systems Review of Systems  Constitutional: Negative.   HENT: Negative.   Respiratory: Negative.   Cardiovascular: Negative.   Gastrointestinal: Positive for abdominal pain, constipation, nausea and vomiting. Negative for abdominal distention, anal bleeding, blood in stool, diarrhea and rectal pain.  Genitourinary: Negative.   Musculoskeletal: Negative.   Skin: Negative.   Neurological: Negative.   All other systems reviewed and are negative.    Physical Exam Updated Vital Signs BP (!) 149/98 (BP Location: Left Arm)   Pulse 89   Temp 98.3 F (36.8 C) (Oral)   Resp 16   Ht 5' 5"  (1.651 m)   Wt 92.8 kg   SpO2 98%   BMI 34.03 kg/m   Physical Exam Vitals signs and nursing note reviewed.  Constitutional:      General: She is not in acute distress.    Appearance: She is well-developed. She is not ill-appearing, toxic-appearing or diaphoretic.     Comments: Patient sitting in room on bed texting in no apparent distress  HENT:     Head: Atraumatic.     Nose: Nose normal.     Mouth/Throat:     Mouth: Mucous membranes are moist.  Eyes:     Pupils: Pupils are equal, round, and reactive to light.  Neck:     Musculoskeletal: Normal range of motion.  Cardiovascular:     Rate and Rhythm: Tachycardia present.     Pulses: Normal pulses.     Heart sounds: Normal heart sounds. No murmur. No friction rub. No gallop.   Pulmonary:     Effort: Pulmonary effort is normal. No respiratory distress.     Breath sounds: Normal breath sounds. No stridor. No wheezing, rhonchi or rales.     Comments: Lungs clear to auscultation bilaterally without wheeze, rhonchi or rales.  No tachypnea.  No accessory muscle usage. Abdominal:     General: Bowel sounds are normal. There is no  distension.     Palpations: Abdomen is soft. There is no shifting dullness, fluid wave, hepatomegaly or mass.     Tenderness: There is abdominal tenderness in the right upper quadrant and epigastric area. There is guarding. There is no right CVA tenderness, left CVA tenderness or rebound.     Hernia: No hernia is present.     Comments: Soft with mild guarding to right upper quadrant tenderness.  Negative murphy sign.  No rebound.  No CVA tenderness.  Musculoskeletal: Normal range of motion.     Comments: Moves all extremities without difficulty.  Skin:    General: Skin is warm and dry.     Comments: No  rashes or lesions.  Neurological:     Mental Status: She is alert.      ED Treatments / Results  Labs (all labs ordered are listed, but only abnormal results are displayed) Labs Reviewed  URINALYSIS, ROUTINE W REFLEX MICROSCOPIC - Abnormal; Notable for the following components:      Result Value   Color, Urine STRAW (*)    Specific Gravity, Urine 1.002 (*)    Hgb urine dipstick LARGE (*)    Ketones, ur 20 (*)    Bacteria, UA RARE (*)    Crystals PRESENT (*)    All other components within normal limits  LIPASE, BLOOD  COMPREHENSIVE METABOLIC PANEL  CBC  I-STAT BETA HCG BLOOD, ED ([facility], [facility], [facility] ONLY)    EKG None  Radiology Ct Abdomen Pelvis W Contrast  Result Date: 08/28/2018 CLINICAL DATA:  Right lower quadrant abdomen pain since August 26, 2018. EXAM: CT ABDOMEN AND PELVIS WITH CONTRAST TECHNIQUE: Multidetector CT imaging of the abdomen and pelvis was performed using the standard protocol following bolus administration of intravenous contrast. CONTRAST:  169m ISOVUE-300 IOPAMIDOL (ISOVUE-300) INJECTION 61% COMPARISON:  None. FINDINGS: Lower chest: Mild atelectasis is identified in the lung bases. The heart size is normal. Hepatobiliary: No focal liver abnormality is seen. No gallstones, gallbladder wall thickening, or biliary dilatation. Pancreas: Unremarkable. No pancreatic  ductal dilatation or surrounding inflammatory changes. Spleen: Normal in size without focal abnormality. Adrenals/Urinary Tract: Adrenal glands are unremarkable. Kidneys are normal, without renal calculi, focal lesion, or hydronephrosis. Bladder is unremarkable. Stomach/Bowel: Stomach is within normal limits. Appendix appears normal. No evidence of bowel wall thickening, distention, or inflammatory changes. Extensive bowel content is identified throughout the colon consistent with constipation. Vascular/Lymphatic: No significant vascular findings are present. No enlarged abdominal or pelvic lymph nodes. Reproductive: Uterus and bilateral adnexa are unremarkable. Other: None. Musculoskeletal: No acute abnormality IMPRESSION: The appendix is normal.  There is no bowel obstruction. Extensive bowel content is identified throughout colon consistent with constipation. No acute abnormality identified in the abdomen and pelvis. Electronically Signed   By: WAbelardo DieselM.D.   On: 08/28/2018 19:03   [place]domen Limited  Result Date: 08/28/2018 CLINICAL DATA:  Right upper quadrant pain EXAM: ULTRASOUND ABDOMEN LIMITED RIGHT UPPER QUADRANT COMPARISON:  None. FINDINGS: Gallbladder: No gallstones or wall thickening visualized. No sonographic Murphy sign noted by sonographer. Common bile duct: Diameter: 5.1 mm Liver: No focal lesion identified. Within normal limits in parenchymal echogenicity. Portal vein is patent on color Doppler imaging with normal direction of blood flow towards the liver. IMPRESSION: Negative right upper quadrant abdominal ultrasound Electronically Signed   By: KDonavan FoilM.D.   On: 08/28/2018 17:31    Procedures Procedures (including critical care time)  Medications Ordered in ED Medications  sodium chloride 0.9 % bolus 1,000 mL (0 mLs Intravenous Stopped 08/28/18 1919)  iopamidol (ISOVUE-300) 61 % injection 100 mL (100 mLs Intravenous Contrast Given 08/28/18 1833)     Initial Impression /  Assessment and Plan / ED Course  I have reviewed the triage vital signs and the nursing notes.  Pertinent labs & imaging results that were available during my care of the patient were reviewed by me and considered in my medical decision making (see chart for details).  28year old female who appears otherwise well presents for evaluation of abdominal pain.  Tenderness to palpation to right upper quadrant and epigastric region.  Pain onset 1 week ago, however worsening over the last 2 days.  Has had  intermittent emesis with nonbloody, nonbilious emesis.  Negative Murphy sign.  Pain worse with p.o. intake.  Patient also states she has been constipated over the last 5 days.  Did use laxatives approximately 5 days ago with bowel movement, however has not had a bowel movement since.  Patient is passing flatulence.  No prior abdominal surgeries.  Afebrile, nonseptic, non-ill-appearing.  Patient with hypertension and tachycardia in triage. Will repeat VS, order labs, urinalysis, fluids and imaging and reevaluate.  CBC without leukocytosis, metabolic panel negative for electrolyte, renal or liver abnormalities, lipase 44, hCG negative, urinalysis with blood, no evidence of infection.  Will obtain right upper quadrant ultrasound to evaluate for gallbladder pathology and reevaluate.  [place] negative.  Patient requesting CT scan at this time.  Will order given patient's pain with negative ultrasound.   CT scan with moderate stool throughout colon. Discussed results with patient. Patient refused rectal exam to assess for possible impaction. Offered enema. Patient declines at this time. I have discussed bowel regimen to use at home. Patient did have elevated blood pressure in the department. Manual recheck of blood pressure at 128/97. Patient asymptomatic without headache, vision changes, chest pain, SOB.  Discussed follow-up with PCP for reevaluation of her blood pressure.  Has been able to tolerate p.o. intake and  department without difficulty.    Patient is nontoxic, nonseptic appearing, in no apparent distress.  Labs, imaging and vitals reviewed.  Patient does not meet the SIRS or Sepsis criteria.  On repeat exam patient does not have a surgical abdomin and there are no peritoneal signs.  No indication of appendicitis, bowel obstruction, bowel perforation, cholecystitis, diverticulitis, PID or ectopic pregnancy. Patient is hemodynamically stable and appropriate for DC him at this time.  I discussed return precautions.  Patient voiced understanding is agreeable for follow-up.  I have also discussed follow-up with GI if she has continued symptoms.   Final Clinical Impressions(s) / ED Diagnoses   Final diagnoses:  Right upper quadrant abdominal pain  Elevated blood pressure reading    ED Discharge Orders    None       Henderly, Britni A, PA-C 08/28/18 2349    Pattricia Boss, MD 08/29/18 1434

## 2018-08-29 ENCOUNTER — Other Ambulatory Visit: Payer: Self-pay | Admitting: Family Medicine

## 2018-09-04 ENCOUNTER — Encounter: Payer: Self-pay | Admitting: Family Medicine

## 2018-09-04 ENCOUNTER — Ambulatory Visit (INDEPENDENT_AMBULATORY_CARE_PROVIDER_SITE_OTHER): Payer: BLUE CROSS/BLUE SHIELD | Admitting: Family Medicine

## 2018-09-04 VITALS — BP 138/78 | HR 91 | Temp 98.2°F | Ht 65.0 in | Wt 205.6 lb

## 2018-09-04 DIAGNOSIS — R030 Elevated blood-pressure reading, without diagnosis of hypertension: Secondary | ICD-10-CM

## 2018-09-04 DIAGNOSIS — F419 Anxiety disorder, unspecified: Secondary | ICD-10-CM | POA: Diagnosis not present

## 2018-09-04 DIAGNOSIS — F339 Major depressive disorder, recurrent, unspecified: Secondary | ICD-10-CM

## 2018-09-04 DIAGNOSIS — Z7689 Persons encountering health services in other specified circumstances: Secondary | ICD-10-CM | POA: Diagnosis not present

## 2018-09-04 NOTE — Progress Notes (Signed)
Bethany Robinson is a 28 y.o. female  No chief complaint on file.   HPI: Bethany Robinson is a 27 y.o. female here to establish care with our office.  She would like flu vaccine today.  She has upcoming appts with GYN due prolonged menstrual bleeding. She also has an upcoming appt with oncology d/t recent positive BRCA2 gene mutation result.  Specialists: psychiatrist and therapist - every 2-4 weeks. Most recent visit was yesterday.  Last CPE, labs: 07/2016 Last PAP: 09/2017 [facility] - result was ASCUS and pt is due for repeat in 09/2018 BRCA-2 gene positive. Mother with breast cancer and MGM and maternal aunts with cancer.  Med refills needed today: none  Pt saw ENT to have ears checked d/t symptoms of dizziness. Pt also has chronic headaches. She was seen in ER for RUQ pain - normal [place] and CT. Pt has dealt with depression as well as anxiety since age 75yo.  She notes a lot of stressors recently - lost part-time job in 06/2018 so finances have been a concern. Boyfriend ended their relationship and threatened not to drive pt back to home from [facility] where they were staying when break-up occurred. Pt spent Christmas alone here in [place]. Recent positive BRCA2 gene testing and upcoming oncology appt has caused understandable upsetment and anxiety.   Pt is from [place] and has no family in the area. She moved to [place] from grad school. She notes few friends, but does state in the past few months she has discovered BDS[employer] community and she is "exploring and liking" this community.   Past Medical History:  Diagnosis Date  . Anxiety   . Common migraine with intractable migraine 09/27/2017  . Depression   . Family history of BRCA2 gene positive   . Family history of breast cancer     Past Surgical History:  Procedure Laterality Date  . TONSILLECTOMY      Social History   Socioeconomic History  . Marital status: Single    Spouse name: Not on file  . Number of children: Not on  file  . Years of education: Not on file  . Highest education level: Not on file  Occupational History  . Not on file  Social Needs  . Financial resource strain: Not on file  . Food insecurity:    Worry: Not on file    Inability: Not on file  . Transportation needs:    Medical: Not on file    Non-medical: Not on file  Tobacco Use  . Smoking status: Never Smoker  . Smokeless tobacco: Never Used  Substance and Sexual Activity  . Alcohol use: Yes    Comment: socially  . Drug use: No  . Sexual activity: Not on file  Lifestyle  . Physical activity:    Days per week: Not on file    Minutes per session: Not on file  . Stress: Not on file  Relationships  . Social connections:    Talks on phone: Not on file    Gets together: Not on file    Attends religious service: Not on file    Active member of club or organization: Not on file    Attends meetings of clubs or organizations: Not on file    Relationship status: Not on file  . Intimate partner violence:    Fear of current or ex partner: Not on file    Emotionally abused: Not on file    Physically abused: Not on  file    Forced sexual activity: Not on file  Other Topics Concern  . Not on file  Social History Narrative  . Not on file    Family History  Problem Relation Age of Onset  . Migraines Father   . BRCA 1/2 Mother        BRCA2 pos  . BRCA 1/2 Maternal Aunt        BRCA2 pos  . Breast cancer Maternal Grandmother      There is no immunization history for the selected administration types on file for this patient.  Outpatient Encounter Medications as of 09/04/2018  Medication Sig  . BuPROPion HBr (APLENZIN) 348 MG TB24 Take 348 mg by mouth daily.  . Cetirizine HCl (ZYRTEC PO) Take 10 mg by mouth daily.   . cloNIDine (CATAPRES) 0.1 MG tablet Take 0.1 mg by mouth daily.   Marland Kitchen etonogestrel (NEXPLANON) 68 MG IMPL implant 1 each by Subdermal route once.  . naproxen (NAPROSYN) 500 MG tablet Take 1 tablet (500 mg total) by  mouth 2 (two) times daily.  . Norgestimate-Ethinyl Estradiol Triphasic (TRI-PREVIFEM) 0.18/0.215/0.25 MG-35 MCG tablet Take 1 tablet by mouth daily.  Marland Kitchen venlafaxine XR (EFFEXOR-XR) 37.5 MG 24 hr capsule TAKE 2 CAPSULES BY MOUTH DAILY WITH BREAKFAST. (Patient taking differently: Take 37.5 mg by mouth daily with breakfast. )   Facility-Administered Encounter Medications as of 09/04/2018  Medication  . gadopentetate dimeglumine (MAGNEVIST) injection 18 mL     ROS: Gen: no fever, chills  Skin: no rash, itching ENT: no ear pain, ear drainage, nasal congestion, rhinorrhea, sinus pressure, sore throat Resp: no cough, wheeze,SOB CV: no CP, palpitations, LE edema,  GI: no heartburn, n/v/d/c, abd pain GU: no dysuria, urgency, frequency, hematuria; + metrorrhagia  MSK: no joint pain, myalgias, back pain Neuro: + dizziness (was recently having, not currently), + headache (chronic issue) Psych: + depression, anxiety, and insomnia   Allergies  Allergen Reactions  . Hydrocodone Swelling  . Topamax [Topiramate]     Cognitive slowing    BP 138/78   Pulse 91   Temp 98.2 F (36.8 C) (Oral)   Ht 5' 5"  (1.651 m)   Wt 205 lb 9.6 oz (93.3 kg)   SpO2 98%   BMI 34.21 kg/m   BP Readings from Last 3 Encounters:  09/04/18 138/78  08/28/18 (!) 149/98  07/27/18 133/90     Physical Exam  Constitutional: She is oriented to person, place, and time. She appears well-developed and well-nourished. No distress.  Cardiovascular: Normal rate, regular rhythm and normal heart sounds.  Neurological: She is alert and oriented to person, place, and time.  Skin: She is diaphoretic.  Psychiatric: Her speech is normal and behavior is normal. Judgment and thought content normal. Her mood appears anxious. Cognition and memory are normal.     A/P:  1. Encounter to establish care with new doctor - due for CPE, repeat PAP from abnormal (ASCUS) in 09/2017  2. Anxiety 3. Depression, recurrent ([place]) - pt follows  with psych and med adjustments were made yesterday at most recent [place] regular f/u with psychiatrist and therapist  4. Elevated BP without diagnosis of hypertension - will recheck at CPE appt  - discussed importance of good sleep, healthy/low sodium diet, regular CV exercise - pts anxiety also likely plays a role - if persistently elevated despite lifestyle modification, could then consider medication but not at this time  Discussed plan and reviewed medications with patient, including risks, benefits, and potential  side effects. Pt expressed understand. All questions answered.

## 2018-09-18 ENCOUNTER — Inpatient Hospital Stay: Payer: BLUE CROSS/BLUE SHIELD | Attending: Genetic Counselor | Admitting: Hematology and Oncology

## 2018-09-18 ENCOUNTER — Telehealth: Payer: Self-pay | Admitting: Genetic Counselor

## 2018-09-18 DIAGNOSIS — Z803 Family history of malignant neoplasm of breast: Secondary | ICD-10-CM | POA: Diagnosis not present

## 2018-09-18 DIAGNOSIS — F418 Other specified anxiety disorders: Secondary | ICD-10-CM | POA: Insufficient documentation

## 2018-09-18 DIAGNOSIS — Z79899 Other long term (current) drug therapy: Secondary | ICD-10-CM | POA: Insufficient documentation

## 2018-09-18 DIAGNOSIS — Z1231 Encounter for screening mammogram for malignant neoplasm of breast: Secondary | ICD-10-CM

## 2018-09-18 DIAGNOSIS — Z1509 Genetic susceptibility to other malignant neoplasm: Secondary | ICD-10-CM | POA: Insufficient documentation

## 2018-09-18 DIAGNOSIS — Z791 Long term (current) use of non-steroidal anti-inflammatories (NSAID): Secondary | ICD-10-CM | POA: Diagnosis not present

## 2018-09-18 DIAGNOSIS — Z1501 Genetic susceptibility to malignant neoplasm of breast: Secondary | ICD-10-CM | POA: Diagnosis not present

## 2018-09-18 NOTE — Progress Notes (Signed)
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 female is here because of recent positive genetic testing for BRCA-2, putting her at high risk for breast cancer. She has a family history of breast cancer in her grandmother, who passed away from it, and several aunts and BRCA-2 positivity in her mother and grandmother. She presents to the clinic alone today. She denies any significant chronic illnesses but notes a history of depression. She is originally from [place], where her whole family is, and just finished getting her masters degree at [employer] in [place] studies. She is currently attempting to find a gynecologist and has never had a mammogram. A few of her mothers cousins had breast cancer in their 37s and 57s and died from it, so her mother has expressed interest in her getting bilateral mastectomies. She is not married and has no children. She works for [employer] and is hoping to get another masters in [employer].   I reviewed her records extensively and collaborated the history with the patient. MEDICAL HISTORY:  Past Medical History:  Diagnosis Date  . Anxiety   . Common migraine with intractable migraine 09/27/2017  . Depression   . Family history of BRCA2 gene positive   . Family history of breast cancer    SURGICAL HISTORY: Past Surgical History:  Procedure Laterality Date  . TONSILLECTOMY      SOCIAL HISTORY: Social History   Socioeconomic History  . Marital status: Single    Spouse name: Not on file  . Number of children: Not on file  . Years of education: Not on file  . Highest education level: Not on file  Occupational History  . Not on file  Social Needs  .  Financial resource strain: Not on file  . Food insecurity:    Worry: Not on file    Inability: Not on file  . Transportation needs:    Medical: Not on file    Non-medical: Not on file  Tobacco Use  . Smoking status: Never Smoker  . Smokeless tobacco: Never Used  Substance and Sexual Activity  . Alcohol use: Yes    Comment: socially  . Drug use: No  . Sexual activity: Not on file  Lifestyle  . Physical activity:    Days per week: Not on file    Minutes per session: Not on file  . Stress: Not on file  Relationships  . Social connections:    Talks on phone: Not on file    Gets together: Not on file    Attends religious service: Not on file    Active member of club or organization: Not on file    Attends meetings of clubs or organizations: Not on file    Relationship status: Not on file  . Intimate partner violence:    Fear of current or ex partner: Not on file    Emotionally abused: Not on file    Physically abused: Not on file    Forced sexual activity: Not on file  Other Topics Concern  . Not on file  Social History Narrative  . Not on file    FAMILY HISTORY: Family History  Problem Relation Age of Onset  . Migraines Father   . BRCA 1/2  Mother        BRCA2 pos  . BRCA 1/2 Maternal Aunt        BRCA2 pos  . Breast cancer Maternal Grandmother     ALLERGIES:  is allergic to hydrocodone and topamax [topiramate].  MEDICATIONS:  Current Outpatient Medications  Medication Sig Dispense Refill  . BuPROPion HBr (APLENZIN) 348 MG TB24 Take 348 mg by mouth daily.    . Cetirizine HCl (ZYRTEC PO) Take 10 mg by mouth daily.     . cloNIDine (CATAPRES) 0.1 MG tablet Take 0.1 mg by mouth daily.     Marland Kitchen etonogestrel (NEXPLANON) 68 MG IMPL implant 1 each by Subdermal route once.    . naproxen (NAPROSYN) 500 MG tablet Take 1 tablet (500 mg total) by mouth 2 (two) times daily. 30 tablet 0  . Norgestimate-Ethinyl Estradiol Triphasic (TRI-PREVIFEM) 0.18/0.215/0.25 MG-35 MCG tablet  Take 1 tablet by mouth daily.    Marland Kitchen venlafaxine XR (EFFEXOR-XR) 37.5 MG 24 hr capsule TAKE 2 CAPSULES BY MOUTH DAILY WITH BREAKFAST. (Patient taking differently: Take 37.5 mg by mouth daily with breakfast. ) 180 capsule 0   No current facility-administered medications for this visit.    Facility-Administered Medications Ordered in Other Visits  Medication Dose Route Frequency Provider Last Rate Last Dose  . gadopentetate dimeglumine (MAGNEVIST) injection 18 mL  18 mL Intravenous Once PRN Kathrynn Ducking, MD        REVIEW OF SYSTEMS:   Constitutional: Denies fevers, chills or abnormal night sweats Eyes: Denies blurriness of vision, double vision or watery eyes Ears, nose, mouth, throat, and face: Denies mucositis or sore throat Respiratory: Denies cough, dyspnea or wheezes Cardiovascular: Denies palpitation, chest discomfort or lower extremity swelling Gastrointestinal:  Denies nausea, heartburn or change in bowel habits Skin: Denies abnormal skin rashes Lymphatics: Denies new lymphadenopathy or easy bruising Neurological:Denies numbness, tingling or new weaknesses Behavioral/Psych: Depression Breast: Denies any palpable lumps or discharge All other systems were reviewed with the patient and are negative.  PHYSICAL EXAMINATION: ECOG PERFORMANCE STATUS: 0 - Asymptomatic  Vitals:   09/18/18 1536  BP: (!) 124/101  Pulse: 99  Resp: 18  Temp: 98.5 F (36.9 C)  SpO2: 100%   Filed Weights   09/18/18 1536  Weight: 203 lb (92.1 kg)    GENERAL:alert, no distress and comfortable SKIN: skin color, texture, turgor are normal, no rashes or significant lesions EYES: normal, conjunctiva are pink and non-injected, sclera clear OROPHARYNX:no exudate, no erythema and lips, buccal mucosa, and tongue normal  NECK: supple, thyroid normal size, non-tender, without nodularity LYMPH:  no palpable lymphadenopathy in the cervical, axillary or inguinal LUNGS: clear to auscultation and percussion  with normal breathing effort HEART: regular rate & rhythm and no murmurs and no lower extremity edema ABDOMEN:abdomen soft, non-tender and normal bowel sounds Musculoskeletal:no cyanosis of digits and no clubbing  PSYCH: alert & oriented x 3 with fluent speech NEURO: no focal motor/sensory deficits  LABORATORY DATA:  I have reviewed the data as listed Lab Results  Component Value Date   WBC 8.9 08/28/2018   HGB 14.8 08/28/2018   HCT 45.5 08/28/2018   MCV 93.0 08/28/2018   PLT 248 08/28/2018   Lab Results  Component Value Date   NA 139 08/28/2018   K 3.9 08/28/2018   CL 105 08/28/2018   CO2 23 08/28/2018    RADIOGRAPHIC STUDIES: I have personally reviewed the radiological reports and agreed with the findings in the report.  ASSESSMENT AND PLAN:  BRCA2 gene  mutation positive BRCA2 gene mutation: [id number] ([id number]*).  I discussed with the patient that BRCA2 is a tumor suppressor gene which helps repair damaged DNA or destroy cells of the VNA cannot be repaired. In patients with BRCA1 or 2 mutations, the damaged DNA could not be repaired properly increasing the risk of cancers. BRCA2 gene is located on long arm of chromosome 13. These mutations are inherited in autosomal dominant fashion and hence 50% probability that their children may have a BRCA mutation.  Cancer risk:  Average to risk of cancer by age of 56: (Antoniou et al pooled pedigree data from 31 studies of 8139 index patients with breast or ovarian cancer) Breast cancer risk: 45 percent (95% CI, 33 to 54 percent) Ovarian cancer risk: 11 percent (95% CI, 4.1 to 18 percent)  [place] study: Tumor limited to risk by age of 68: Breast cancer risk: 58 percent (95% CI, 41 to 70 percent) Ovarian cancer risk: 16.5 percent (95% CI, 7.5 to 34 percent)  I discussed the difference between BRCA1 and BRCA2 wherein patients BRCA1 and 2 have early onset disease and much higher risk of breast cancer. The mean age of diagnosis of breast  cancer between BRCA1 and 2 are 78 versus 36 years, risk of ovarian cancer is also higher with BRCA1 but the overall risk under the age of 44 is very low.  Other cancer risks:  1. Pancreatic cancer (relative risk is 3.51) with incidence of 4.9% in BRCA2 carriers 2. Fallopian tube carcinoma and primary peritoneal carcinoma 3. Uterine papillary serous carcinoma: Overall risk is very low 4. Colorectal cancers: In many studies there was no increased risk But there may be some for BRCA1 carriers 5. Melanoma and other skin cancers: The risk of oatmeal melanoma is increase in BRCA2 mutation carriers but it still extremely rare. 6. Endometrial cancer: Not very clear in terms of risks it is thought to be very low  Breast cancer risk reduction/surveillance: 1. Annual mammogram and breast MRI are recommended by NCCN starting at age of 78-30 2. Chemoprevention with tamoxifen-like agents is not entirely clear. Based on NSABP P1 study in the small cohort of patients with BRCA1 mutations, tamoxifen to reduce breast cancer risk by 62% and BRCA2 carriers but not in BRCA1 carriers. The study is limited because of small numbers. I do not recommend it primarily because most commonly patients with BRCA mutations tend to have triple negative disease. 3. After childbearing, evaluation for prophylactic bilateral mastectomy is an option. 4. Oophorectomy self reduces the risk of breast cancer by 50% 5. Breast self-examinations starting at age 38   Ovarian cancer risk reduction: 1. Risk reducing bilateral salpingo-oophorectomy between the ages of 57-40 once childbearing is complete is recommended. In one study that was 72% reduction of risk of ovarian cancer and a decrease in breast cancer by approximately 50% 2. There is no clear data for surveillance with CA125 or vaginal ultrasounds 2. Oral contraception dose may be protective against ovarian cancer but it may slightly increase risk of breast cancer.  Plan: 1.   Mammogram has been ordered for next week 2.  Breast MRI will be planned for June 2020 Patient is debating about proceeding with bilateral mastectomies because her mother has suggested that to her given  the number of family members with breast cancer who had passed. She is debating her options and will inform us if she decides to undergo bilateral mastectomies. She is also completed a masters program in [place] studies and is planning to  start a new masters program in [employer]. Depending on where she ends up she may continue the surveillance/treatment at that location.  We will see the patient annually for checkups. Depression: Having this gene abnormality has also significantly affected her emotionally.   All questions were answered. The patient knows to call the clinic with any problems, questions or concerns.   Nicholas Lose, MD 09/18/2018   I, Cloyde Reams Dorshimer, am acting as scribe for Nicholas Lose, MD.  I have reviewed the above documentation for accuracy and completeness, and I agree with the above.

## 2018-09-18 NOTE — Assessment & Plan Note (Addendum)
BRCA2 gene mutation: [id number] ([id number]*).  I discussed with the patient that BRCA2 is a tumor suppressor gene which helps repair damaged DNA or destroy cells of the VNA cannot be repaired. In patients with BRCA1 or 2 mutations, the damaged DNA could not be repaired properly increasing the risk of cancers. BRCA2 gene is located on long arm of chromosome 13. These mutations are inherited in autosomal dominant fashion and hence 50% probability that their children may have a BRCA mutation.  Cancer risk:  Average to risk of cancer by age of 70: (Antoniou et al pooled pedigree data from 22 studies of 8139 index patients with breast or ovarian cancer) Breast cancer risk: 45 percent (95% CI, 33 to 54 percent) Ovarian cancer risk: 11 percent (95% CI, 4.1 to 18 percent)  [place] study: Tumor limited to risk by age of 70: Breast cancer risk: 55 percent (95% CI, 41 to 70 percent) Ovarian cancer risk: 16.5 percent (95% CI, 7.5 to 34 percent)  I discussed the difference between BRCA1 and BRCA2 wherein patients BRCA1 and 2 have early onset disease and much higher risk of breast cancer. The mean age of diagnosis of breast cancer between BRCA1 and 2 are 43 versus 47 years, risk of ovarian cancer is also higher with BRCA1 but the overall risk under the age of 40 is very low.  Other cancer risks:  1. Pancreatic cancer (relative risk is 3.51) with incidence of 4.9% in BRCA2 carriers 2. Fallopian tube carcinoma and primary peritoneal carcinoma 3. Uterine papillary serous carcinoma: Overall risk is very low 4. Colorectal cancers: In many studies there was no increased risk But there may be some for BRCA1 carriers 5. Melanoma and other skin cancers: The risk of oatmeal melanoma is increase in BRCA2 mutation carriers but it still extremely rare. 6. Endometrial cancer: Not very clear in terms of risks it is thought to be very low  Breast cancer risk reduction/surveillance: 1. Annual mammogram and breast MRI are  recommended by NCCN starting at age of 25-30 2. Chemoprevention with tamoxifen-like agents is not entirely clear. Based on NSABP P1 study in the small cohort of patients with BRCA1 mutations, tamoxifen to reduce breast cancer risk by 62% and BRCA2 carriers but not in BRCA1 carriers. The study is limited because of small numbers. I do not recommend it primarily because most commonly patients with BRCA mutations tend to have triple negative disease. 3. After childbearing, evaluation for prophylactic bilateral mastectomy is an option. 4. Oophorectomy self reduces the risk of breast cancer by 50% 5. Breast self-examinations starting at age 18   Ovarian cancer risk reduction: 1. Risk reducing bilateral salpingo-oophorectomy between the ages of 35-40 once childbearing is complete is recommended. In one study that was 72% reduction of risk of ovarian cancer and a decrease in breast cancer by approximately 50% 2. There is no clear data for surveillance with CA125 or vaginal ultrasounds 2. Oral contraception dose may be protective against ovarian cancer but it may slightly increase risk of breast cancer.   

## 2018-09-18 NOTE — Telephone Encounter (Signed)
LM for the research compliance person regarding the patient's mother's genetic testing.  Asked that she CB.

## 2018-09-19 NOTE — Telephone Encounter (Signed)
LM on VM stating that I had sent an email regarding her mother's testing and my conversation with the research lab that performed it.  The patient should let her mother know that she should undergo confirmation testing. This is something that was mentioned in the original letter to her mother.  Her mother can undergo genetic testing for 90 days after Eunise had her testing for free for confirmation.

## 2018-09-24 ENCOUNTER — Other Ambulatory Visit: Payer: Self-pay | Admitting: Hematology and Oncology

## 2018-09-24 ENCOUNTER — Ambulatory Visit
Admission: RE | Admit: 2018-09-24 | Discharge: 2018-09-24 | Disposition: A | Discharge status: Home / Self Care | Payer: BLUE CROSS/BLUE SHIELD | Source: Ambulatory Visit | Attending: Hematology and Oncology | Admitting: Hematology and Oncology

## 2018-09-24 DIAGNOSIS — Z1231 Encounter for screening mammogram for malignant neoplasm of breast: Secondary | ICD-10-CM

## 2018-09-24 DIAGNOSIS — Z1509 Genetic susceptibility to other malignant neoplasm: Principal | ICD-10-CM

## 2018-09-24 DIAGNOSIS — Z1501 Genetic susceptibility to malignant neoplasm of breast: Secondary | ICD-10-CM

## 2018-09-25 ENCOUNTER — Encounter: Payer: Self-pay | Admitting: Obstetrics & Gynecology

## 2018-09-25 ENCOUNTER — Ambulatory Visit: Payer: BLUE CROSS/BLUE SHIELD | Admitting: Obstetrics & Gynecology

## 2018-09-25 VITALS — BP 128/84 | Ht 66.0 in | Wt 201.0 lb

## 2018-09-25 DIAGNOSIS — Z1509 Genetic susceptibility to other malignant neoplasm: Secondary | ICD-10-CM | POA: Diagnosis not present

## 2018-09-25 DIAGNOSIS — N921 Excessive and frequent menstruation with irregular cycle: Secondary | ICD-10-CM | POA: Diagnosis not present

## 2018-09-25 DIAGNOSIS — Z1501 Genetic susceptibility to malignant neoplasm of breast: Secondary | ICD-10-CM

## 2018-09-25 DIAGNOSIS — Z975 Presence of (intrauterine) contraceptive device: Secondary | ICD-10-CM | POA: Diagnosis not present

## 2018-09-25 NOTE — Progress Notes (Signed)
    Bethany Robinson 03-03-1991 [id number]        27 y.o.  G0P0000 Single  RP: BTB on Nexplanon and BrCa2 positive  HPI: Nexplanon x 03/2016.  BTB most days.  Presented to Urgent Care, started on BCPs x 1 pack.  No longer having vaginal bleeding.  No pelvic pain.  Strong Fam h/o Breast Ca and Ovarian Ca.  Mother is BrCa2 positive and had a Bilateral Mastectomy/Total Hysterectomy/BSO in her 24's.  Patient is BrCa2 positive.  Recent genetic/onco consultation.  Decision to alternate between 3D mammo/MRI of breasts every 6 months and have Pelvic US every 6 months.  3D Mammo 09/24/2018 negative.   OB History  Gravida Para Term Preterm AB Living  0 0 0 0 0 0  SAB TAB Ectopic Multiple Live Births  0 0 0 0 0    Past medical history,surgical history, problem list, medications, allergies, family history and social history were all reviewed and documented in the EPIC chart.   Directed ROS with pertinent positives and negatives documented in the history of present illness/assessment and plan.  Exam:  Vitals:   09/25/18 1036  BP: 128/84  Weight: 201 lb (91.2 kg)  Height: 5' 6" (1.676 m)   General appearance:  Normal  Abdomen: Normal  Gynecologic exam: Vulva normal.  Speculum:  Uterus AV, normal volume, mobile, NT.  No adnexal mass, NT bilaterally   Assessment/Plan:  28 y.o. G0P0000   1. Breakthrough bleeding on Nexplanon Persistent breakthrough bleeding on Nexplanon with improvement after the addition of a birth control pill.  Will rule out intrauterine pathology with a pelvic ultrasound.  Follow-up pelvic ultrasound. - US Transvaginal Non-OB; Future  2. BRCA2 positive Very strong family history of breast cancer and ovarian cancer with BRCA2 positive status.  Patient seen for genetic counseling and decision made to alternate between breast MRIs and 3D mammograms every 6 months.  3D mammogram was negative in January 2020.  Given the risk of ovarian cancer will do pelvic ultrasounds every 6  months.  Patient will follow-up now for pelvic ultrasound. - US Transvaginal Non-OB; Future  Other orders - traZODone (DESYREL) 50 MG tablet; Take 50 mg by mouth at bedtime as needed for sleep.  Counseling on above issues and coordination of care more than 50% for 30 minutes.  Princess Bruins MD, 11:00 AM 09/25/2018

## 2018-10-02 ENCOUNTER — Encounter: Payer: Self-pay | Admitting: Obstetrics & Gynecology

## 2018-10-02 NOTE — Patient Instructions (Addendum)
1. Breakthrough bleeding on Nexplanon Persistent breakthrough bleeding on Nexplanon with improvement after the addition of a birth control pill.  Will rule out intrauterine pathology with a pelvic ultrasound.  Follow-up pelvic ultrasound. - US Transvaginal Non-OB; Future  2. BRCA2 positive Very strong family history of breast cancer and ovarian cancer with BRCA2 positive status.  Patient seen for genetic counseling and decision made to alternate between breast MRIs and 3D mammograms every 6 months.  3D mammogram was negative in January 2020.  Given the risk of ovarian cancer will do pelvic ultrasounds every 6 months.  Patient will follow-up now for pelvic ultrasound. - US Transvaginal Non-OB; Future  Other orders - traZODone (DESYREL) 50 MG tablet; Take 50 mg by mouth at bedtime as needed for sleep.  Bethany Robinson, it was a pleasure meeting you today!

## 2018-10-04 ENCOUNTER — Encounter: Payer: Self-pay | Admitting: Family Medicine

## 2018-10-04 ENCOUNTER — Other Ambulatory Visit (HOSPITAL_COMMUNITY)
Admission: RE | Admit: 2018-10-04 | Discharge: 2018-10-04 | Disposition: A | Discharge status: Home / Self Care | Payer: BLUE CROSS/BLUE SHIELD | Source: Ambulatory Visit | Attending: Family Medicine | Admitting: Family Medicine

## 2018-10-04 ENCOUNTER — Ambulatory Visit (INDEPENDENT_AMBULATORY_CARE_PROVIDER_SITE_OTHER): Payer: BLUE CROSS/BLUE SHIELD | Admitting: Family Medicine

## 2018-10-04 VITALS — BP 118/86 | HR 96 | Temp 97.9°F | Ht 66.0 in | Wt 199.6 lb

## 2018-10-04 DIAGNOSIS — Z124 Encounter for screening for malignant neoplasm of cervix: Secondary | ICD-10-CM | POA: Diagnosis not present

## 2018-10-04 DIAGNOSIS — Z23 Encounter for immunization: Secondary | ICD-10-CM | POA: Diagnosis not present

## 2018-10-04 DIAGNOSIS — Z0000 Encounter for general adult medical examination without abnormal findings: Secondary | ICD-10-CM

## 2018-10-04 LAB — ALT: ALT: 17 U/L (ref 0–35)

## 2018-10-04 LAB — LIPID PANEL
CHOLESTEROL: 171 mg/dL (ref 0–200)
HDL: 41.2 mg/dL (ref >39.00)
LDL Cholesterol: 116 mg/dL — ABNORMAL HIGH (ref 0–99)
NonHDL: 129.39
Total CHOL/HDL Ratio: 4
Triglycerides: 67 mg/dL (ref 0.0–149.0)
VLDL: 13.4 mg/dL (ref 0.0–40.0)

## 2018-10-04 LAB — BASIC METABOLIC PANEL
BUN: 13 mg/dL (ref 6–23)
CO2: 25 mEq/L (ref 19–32)
Calcium: 9.2 mg/dL (ref 8.4–10.5)
Chloride: 107 mEq/L (ref 96–112)
Creatinine, Ser: 0.89 mg/dL (ref 0.40–1.20)
GFR: 77.05 mL/min (ref >60.00)
Glucose, Bld: 88 mg/dL (ref 70–99)
Potassium: 4.2 mEq/L (ref 3.5–5.1)
Sodium: 141 mEq/L (ref 135–145)

## 2018-10-04 LAB — AST: AST: 16 U/L (ref 0–37)

## 2018-10-04 NOTE — Progress Notes (Signed)
Bethany Robinson is a 28 y.o. female  Chief Complaint  Patient presents with  . Annual Exam    CPE-- fasting/ PAP/ Not sure about TDAP/ wants flu shot/ wants HIV screening    HPI: Bethany Robinson is a 28 y.o. female here today for CPE, fasting labs, PAP. She would like a flu vaccine today. She believes she received Tdap vaccine but will check to confirm and come for RN visit to get if needed. No concerns today. She had her mammogram and saw oncology/genetics regarding BRCA positive testing since our last OV.   Last PAP: due Last mammo: 1/20 Last Dexa: n/a Last colonoscopy: n/a  Diet/Exercise: needs to improve diet - take-out, frozen foods; no exercise    Past Medical History:  Diagnosis Date  . Anxiety   . Common migraine with intractable migraine 09/27/2017  . Depression   . Family history of BRCA2 gene positive   . Family history of breast cancer     Past Surgical History:  Procedure Laterality Date  . TONSILLECTOMY      Social History   Socioeconomic History  . Marital status: Single    Spouse name: Not on file  . Number of children: Not on file  . Years of education: Not on file  . Highest education level: Not on file  Occupational History  . Not on file  Social Needs  . Financial resource strain: Not on file  . Food insecurity:    Worry: Not on file    Inability: Not on file  . Transportation needs:    Medical: Not on file    Non-medical: Not on file  Tobacco Use  . Smoking status: Never Smoker  . Smokeless tobacco: Never Used  Substance and Sexual Activity  . Alcohol use: Yes    Comment: socially  . Drug use: No  . Sexual activity: Yes    Birth control/protection: Implant    Comment: 1st intercourse- 21, partners- greater than 10,   Lifestyle  . Physical activity:    Days per week: Not on file    Minutes per session: Not on file  . Stress: Not on file  Relationships  . Social connections:    Talks on phone: Not on file    Gets together: Not  on file    Attends religious service: Not on file    Active member of club or organization: Not on file    Attends meetings of clubs or organizations: Not on file    Relationship status: Not on file  . Intimate partner violence:    Fear of current or ex partner: Not on file    Emotionally abused: Not on file    Physically abused: Not on file    Forced sexual activity: Not on file  Other Topics Concern  . Not on file  Social History Narrative  . Not on file    Family History  Problem Relation Age of Onset  . Migraines Father   . BRCA 1/2 Mother        BRCA2 pos  . BRCA 1/2 Maternal Aunt        BRCA2 pos  . Breast cancer Maternal Grandmother   . Breast cancer Other      Immunization History  Administered Date(s) Administered  . Influenza Inj Mdck Quad Pf 05/31/2017  . Influenza,inj,Quad PF,6+ Mos 10/04/2018    Outpatient Encounter Medications as of 10/04/2018  Medication Sig  . BuPROPion HBr (APLENZIN) 348 MG TB24 Take 348 mg  by mouth daily.  . Cetirizine HCl (ZYRTEC PO) Take 10 mg by mouth daily.   . cloNIDine (CATAPRES) 0.1 MG tablet Take 0.1 mg by mouth daily.   . naproxen (NAPROSYN) 500 MG tablet Take 1 tablet (500 mg total) by mouth 2 (two) times daily.  . Norgestimate-Ethinyl Estradiol Triphasic (TRI-PREVIFEM) 0.18/0.215/0.25 MG-35 MCG tablet Take 1 tablet by mouth daily.  . traZODone (DESYREL) 50 MG tablet Take 50 mg by mouth at bedtime as needed for sleep.  Marland Kitchen venlafaxine XR (EFFEXOR-XR) 37.5 MG 24 hr capsule TAKE 2 CAPSULES BY MOUTH DAILY WITH BREAKFAST. (Patient taking differently: Take 37.5 mg by mouth daily with breakfast. )  . etonogestrel (NEXPLANON) 68 MG IMPL implant 1 each by Subdermal route once.   Facility-Administered Encounter Medications as of 10/04/2018  Medication  . gadopentetate dimeglumine (MAGNEVIST) injection 18 mL     ROS: Gen: no fever, chills  Skin: no rash, itching ENT: no ear pain, ear drainage, nasal congestion, rhinorrhea, sinus  pressure, sore throat Eyes: no blurry vision, double vision Resp: no cough, wheeze,SOB Breast: no breast tenderness, no nipple discharge, no breast masses CV: no CP, palpitations, LE edema,  GI: no heartburn, n/v/d, abd pain, + constipation GU: no dysuria, urgency, frequency, hematuria; no vaginal itching, odor, discharge MSK: no joint pain, myalgias, back pain Neuro: no dizziness, headache, weakness, vertigo Psych: + anxiety   Allergies  Allergen Reactions  . Hydrocodone Swelling  . Topamax [Topiramate]     Cognitive slowing    BP 118/86   Pulse 96   Temp 97.9 F (36.6 C) (Oral)   Ht 5' 6"  (1.676 m)   Wt 199 lb 9.6 oz (90.5 kg)   SpO2 97%   BMI 32.22 kg/m   Physical Exam   A/P:  1. Need for influenza vaccination - Flu Vaccine QUAD 6+ mos PF IM (Fluarix Quad PF)  2. Annual physical exam - due for eye exam, dental exam - stressed importance of health diet, regular exercise, adequate sleep - PAP today - pt had mammo in 08/2018 d/t mother with breast cancer and pt has BRCA mutation - ALT - AST - Basic metabolic panel - Lipid panel - HIV Antibody (routine testing w rflx) - next CPE in 1 year  3. Screening for cervical cancer - Cytology - PAP( [place])

## 2018-10-05 LAB — HIV ANTIBODY (ROUTINE TESTING W REFLEX): HIV 1&2 Ab, 4th Generation: NONREACTIVE

## 2018-10-07 LAB — CYTOLOGY - PAP
Adequacy: ABSENT
Diagnosis: NEGATIVE

## 2018-10-24 ENCOUNTER — Ambulatory Visit: Payer: BLUE CROSS/BLUE SHIELD | Admitting: Obstetrics & Gynecology

## 2018-10-24 ENCOUNTER — Encounter: Payer: Self-pay | Admitting: Obstetrics & Gynecology

## 2018-10-24 ENCOUNTER — Ambulatory Visit: Payer: BLUE CROSS/BLUE SHIELD | Admitting: Family Medicine

## 2018-10-24 ENCOUNTER — Ambulatory Visit (INDEPENDENT_AMBULATORY_CARE_PROVIDER_SITE_OTHER): Payer: BLUE CROSS/BLUE SHIELD

## 2018-10-24 VITALS — BP 128/90 | Wt 204.6 lb

## 2018-10-24 DIAGNOSIS — N921 Excessive and frequent menstruation with irregular cycle: Secondary | ICD-10-CM

## 2018-10-24 DIAGNOSIS — Z8041 Family history of malignant neoplasm of ovary: Secondary | ICD-10-CM | POA: Diagnosis not present

## 2018-10-24 DIAGNOSIS — Z975 Presence of (intrauterine) contraceptive device: Secondary | ICD-10-CM | POA: Diagnosis not present

## 2018-10-24 DIAGNOSIS — Z1501 Genetic susceptibility to malignant neoplasm of breast: Secondary | ICD-10-CM

## 2018-10-24 DIAGNOSIS — Z1509 Genetic susceptibility to other malignant neoplasm: Secondary | ICD-10-CM

## 2018-10-24 NOTE — Progress Notes (Signed)
    Bethany Robinson [birth year] [id number]        28 y.o.  G0P0000   RP: BTB on Nexplanon/Family h/o Ovarian Cancer for Pelvic US  HPI: Breakthrough bleeding on Nexplanon.  Finishing a mild menstrual period today.  No pelvic pain.  Family history of ovarian cancer.   OB History  Gravida Para Term Preterm AB Living  0 0 0 0 0 0  SAB TAB Ectopic Multiple Live Births  0 0 0 0 0    Past medical history,surgical history, problem list, medications, allergies, family history and social history were all reviewed and documented in the EPIC chart.   Directed ROS with pertinent positives and negatives documented in the history of present illness/assessment and plan.  Exam:  Vitals:   10/24/18 1400  BP: 128/90  Weight: 204 lb 9.6 oz (92.8 kg)   General appearance:  Normal  Pelvic US today: T/V images.  Anteverted uterus normal size measuring 6.0 x 3.22 x 2.81 cm with no myometrial mass.  Thin symmetrical endometrial lining measured at 1.7 mm with a small amount of fluid in the cavity (end of menses) which outlines smooth walls with no mass.  Both ovaries are normal in size with normal follicular pattern.  No adnexal mass seen.  No free fluid in the posterior cul-de-sac.   Assessment/Plan:  28 y.o. G0  1. Breakthrough bleeding on Nexplanon Pelvic ultrasound findings reviewed with patient.  Reassured that the endometrial cavity shows a thin lining with no lesion.  Will observe, continuing on Nexplanon.  2. Family history of ovarian cancer Patient reassured that both ovaries are normal with no free fluid in the posterior cul-de-sac.  Counseling on above issues and coordination of care more than 50% for 15 minutes.  Follow-up for annual gynecologic exam.  Genia Del MD, 2:10 PM 10/24/2018

## 2018-10-24 NOTE — Patient Instructions (Signed)
1. Breakthrough bleeding on Nexplanon Pelvic ultrasound findings reviewed with patient.  Reassured that the endometrial cavity shows a thin lining with no lesion.  Will observe, continuing on Nexplanon.  2. Family history of ovarian cancer Patient reassured that both ovaries are normal with no free fluid in the posterior cul-de-sac.  Bethany Robinson, it was a pleasure seeing you today!

## 2019-01-07 ENCOUNTER — Encounter: Payer: Self-pay | Admitting: Family Medicine

## 2019-02-17 ENCOUNTER — Other Ambulatory Visit: Payer: Self-pay

## 2019-02-18 ENCOUNTER — Ambulatory Visit (INDEPENDENT_AMBULATORY_CARE_PROVIDER_SITE_OTHER): Payer: BC Managed Care – PPO | Admitting: Obstetrics & Gynecology

## 2019-02-18 ENCOUNTER — Encounter: Payer: Self-pay | Admitting: Obstetrics & Gynecology

## 2019-02-18 VITALS — BP 122/78

## 2019-02-18 DIAGNOSIS — Z30017 Encounter for initial prescription of implantable subdermal contraceptive: Secondary | ICD-10-CM

## 2019-02-18 DIAGNOSIS — Z3046 Encounter for surveillance of implantable subdermal contraceptive: Secondary | ICD-10-CM

## 2019-02-18 DIAGNOSIS — Z1501 Genetic susceptibility to malignant neoplasm of breast: Secondary | ICD-10-CM

## 2019-02-18 DIAGNOSIS — Z1509 Genetic susceptibility to other malignant neoplasm: Secondary | ICD-10-CM

## 2019-02-18 DIAGNOSIS — N921 Excessive and frequent menstruation with irregular cycle: Secondary | ICD-10-CM

## 2019-02-18 NOTE — Progress Notes (Signed)
Bethany Robinson 10-22-1990 [id number]        27 y.o.  G0 Single  RP: BTB on Nexplanon for removal/insertion  HPI: More BTB on Nexplanon x 08/2018, in place since 03/2016.  No pelvic pain.  Fam h/o Ovarian cancer.  Wants to remove/insert a new Nexplanon.   OB History  Gravida Para Term Preterm AB Living  0 0 0 0 0 0  SAB TAB Ectopic Multiple Live Births  0 0 0 0 0    Past medical history,surgical history, problem list, medications, allergies, family history and social history were all reviewed and documented in the EPIC chart.   Directed ROS with pertinent positives and negatives documented in the history of present illness/assessment and plan.  Exam:  Vitals:   02/18/19 0932  BP: 122/78   General appearance:  Normal                                                             Nexplanon procedure note (removal)  The patient presented to the office today requesting for removal of her Nexplanon that was placed in the year 03/2016 on her Left arm.   On examination the nexplanon implant was palpated and the distal end  (end  closest to the elbow) was marked. The area was sterilized with Betadine solution. 1% lidocaine was used for local anesthesia and approximately 1 cc  was injected into the site that was marked where the incision was to be made. The local anesthetic was injected under the implant in an effort to keep it  close to the skin surface. Slight pressure pushing downward was made at the proximal end  of the implant in an effort to stabilize it. A bulge appeared indicating the distal end of the implant. A small transverse incision of 2 mm was made at that location. By gently pushing the implant toward the incision, the tip became visible. Grasping the implant with a curved forcep facilitated in gently removing the implant. Full confirmation of the entire implant which is 4 cm long was inspected and was intact and was shown to the patient and discarded.                      Nexplanon Procedure Note (insertion)   The patient was laying on her back with her nondominant arm flexed at the elbow and externally rotated.  The removal incision was used for insertion of the new Nexplanon above the Triceps muscle.  The applicator was held above the needle at the textured surface area. The transparent protector was removed. With a freehand, the skin was stretched around the insertion site with a thumb and index finger. The skin was entered at the incision site with the tip of the needle angled at 30. The Nexplanon applicator was lowered to a horizontal position. While lifting the skin with the tip of the needle the needle was then slid to its full length. The applicator was kept in this position with the needle inserted to its full length. The purple slider was unlocked by pushing it slightly downward. The slider was fully moved back until it stopped. This allowed the implant to be in the final subdermal position and the needle to be locked inside the body of the applicator. The applicator  was then removed. 3 Steri-Strips were applied over the incision, a band-aid and a bandage was placed which the patient is to remove tomorrow. No complications, the patient tolerated procedure well and was released home with instructions.   Assessment/Plan:  28 y.o. G0  1. Breakthrough bleeding on Nexplanon Doing well with Nexplanon except for breakthrough bleeding in the past for 5 months.  Almost 3 years since insertion anyways.  Decision to remove the Nexplanon and inserted a new one.  2. Encounter for Nexplanon removal Easy Nexplanon removal without complication.  Well-tolerated by patient.  3. Insertion of Nexplanon Easy insertion of Nexplanon without complication.  Well-tolerated by patient.  Precautions post insertion discussed.  4. BRCA2 positive Family history of ovarian cancer with BRCA2 positive status.  Nexplanon contraception is a good method would to lower the  risk of ovarian cancer.  Princess Bruins MD, 9:42 AM 02/18/2019

## 2019-02-21 ENCOUNTER — Encounter: Payer: Self-pay | Admitting: Obstetrics & Gynecology

## 2019-02-21 NOTE — Patient Instructions (Signed)
1. Breakthrough bleeding on Nexplanon Doing well with Nexplanon except for breakthrough bleeding in the past for 5 months.  Almost 3 years since insertion anyways.  Decision to remove the Nexplanon and inserted a new one.  2. Encounter for Nexplanon removal Easy Nexplanon removal without complication.  Well-tolerated by patient.  3. Insertion of Nexplanon Easy insertion of Nexplanon without complication.  Well-tolerated by patient.  Precautions post insertion discussed.  4. BRCA2 positive Family history of ovarian cancer with BRCA2 positive status.  Nexplanon contraception is a good method would to lower the risk of ovarian cancer.  Ethelyn, it was a pleasure seeing you today!

## 2019-02-27 ENCOUNTER — Encounter: Payer: Self-pay | Admitting: Anesthesiology

## 2019-03-25 ENCOUNTER — Telehealth: Payer: Self-pay | Admitting: *Deleted

## 2019-03-25 DIAGNOSIS — Z1501 Genetic susceptibility to malignant neoplasm of breast: Secondary | ICD-10-CM

## 2019-03-25 DIAGNOSIS — Z8041 Family history of malignant neoplasm of ovary: Secondary | ICD-10-CM

## 2019-03-25 NOTE — Telephone Encounter (Signed)
Patient called asking if pelvic ultrasound should be scheduled, was told to schedule every 6 months due to BrCa2 positive? Last ultrasound was done here on 10/24/18.  She has breast MRI scheduled on 04/09/19, order by another physician.  Should ultrasound be scheduled in August? Please advise

## 2019-03-25 NOTE — Telephone Encounter (Signed)
Yes pelvic US end of August.

## 2019-03-26 NOTE — Telephone Encounter (Signed)
Order placed, will route to appointment desk to call and schedule patient.

## 2019-03-26 NOTE — Telephone Encounter (Signed)
Patient had scheduled u/s for Sept 3 and called her today to confirm order was placed.

## 2019-04-09 ENCOUNTER — Ambulatory Visit
Admission: RE | Admit: 2019-04-09 | Discharge: 2019-04-09 | Disposition: A | Discharge status: Home / Self Care | Payer: BC Managed Care – PPO | Source: Ambulatory Visit | Attending: Hematology and Oncology | Admitting: Hematology and Oncology

## 2019-04-09 ENCOUNTER — Other Ambulatory Visit: Payer: Self-pay

## 2019-04-09 DIAGNOSIS — Z1231 Encounter for screening mammogram for malignant neoplasm of breast: Secondary | ICD-10-CM

## 2019-04-09 MED ORDER — GADOBUTROL 1 MMOL/ML IV SOLN
10.0000 mL | Freq: Once | INTRAVENOUS | Status: AC | PRN
  Administered 2019-04-09: 11:00:00 10 mL via INTRAVENOUS

## 2019-04-13 ENCOUNTER — Other Ambulatory Visit: Payer: Self-pay

## 2019-04-13 ENCOUNTER — Encounter (HOSPITAL_COMMUNITY): Payer: Self-pay

## 2019-04-13 ENCOUNTER — Emergency Department (HOSPITAL_COMMUNITY)
Admission: EM | Admit: 2019-04-13 | Discharge: 2019-04-13 | Disposition: A | Discharge status: Home / Self Care | Payer: BC Managed Care – PPO | Attending: Emergency Medicine | Admitting: Emergency Medicine

## 2019-04-13 DIAGNOSIS — R251 Tremor, unspecified: Secondary | ICD-10-CM | POA: Diagnosis present

## 2019-04-13 DIAGNOSIS — F1290 Cannabis use, unspecified, uncomplicated: Secondary | ICD-10-CM | POA: Diagnosis not present

## 2019-04-13 DIAGNOSIS — Z79899 Other long term (current) drug therapy: Secondary | ICD-10-CM | POA: Insufficient documentation

## 2019-04-13 DIAGNOSIS — F419 Anxiety disorder, unspecified: Secondary | ICD-10-CM | POA: Insufficient documentation

## 2019-04-13 LAB — COMPREHENSIVE METABOLIC PANEL
ALT: 16 U/L (ref 0–44)
AST: 17 U/L (ref 15–41)
Albumin: 4.2 g/dL (ref 3.5–5.0)
Alkaline Phosphatase: 62 U/L (ref 38–126)
Anion gap: 10 (ref 5–15)
BUN: 17 mg/dL (ref 6–20)
CO2: 24 mmol/L (ref 22–32)
Calcium: 9.8 mg/dL (ref 8.9–10.3)
Chloride: 105 mmol/L (ref 98–111)
Creatinine, Ser: 0.86 mg/dL (ref 0.44–1.00)
GFR calc Af Amer: >60 mL/min (ref >60)
GFR calc non Af Amer: >60 mL/min (ref >60)
Glucose, Bld: 115 mg/dL — ABNORMAL HIGH (ref 70–99)
Potassium: 4 mmol/L (ref 3.5–5.1)
Sodium: 139 mmol/L (ref 135–145)
Total Bilirubin: 0.4 mg/dL (ref 0.3–1.2)
Total Protein: 7.4 g/dL (ref 6.5–8.1)

## 2019-04-13 LAB — CBC WITH DIFFERENTIAL/PLATELET
Abs Immature Granulocytes: 0.03 10*3/uL (ref 0.00–0.07)
Basophils Absolute: 0 10*3/uL (ref 0.0–0.1)
Basophils Relative: 0 %
Eosinophils Absolute: 0.1 10*3/uL (ref 0.0–0.5)
Eosinophils Relative: 1 %
HCT: 42.6 % (ref 36.0–46.0)
Hemoglobin: 14.4 g/dL (ref 12.0–15.0)
Immature Granulocytes: 0 %
Lymphocytes Relative: 11 %
Lymphs Abs: 1.3 10*3/uL (ref 0.7–4.0)
MCH: 30.1 pg (ref 26.0–34.0)
MCHC: 33.8 g/dL (ref 30.0–36.0)
MCV: 88.9 fL (ref 80.0–100.0)
Monocytes Absolute: 0.5 10*3/uL (ref 0.1–1.0)
Monocytes Relative: 4 %
Neutro Abs: 9.4 10*3/uL — ABNORMAL HIGH (ref 1.7–7.7)
Neutrophils Relative %: 84 %
Platelets: 227 10*3/uL (ref 150–400)
RBC: 4.79 MIL/uL (ref 3.87–5.11)
RDW: 12.6 % (ref 11.5–15.5)
WBC: 11.3 10*3/uL — ABNORMAL HIGH (ref 4.0–10.5)
nRBC: 0 % (ref 0.0–0.2)

## 2019-04-13 LAB — ETHANOL: Alcohol, Ethyl (B): <10 mg/dL (ref <10)

## 2019-04-13 LAB — SALICYLATE LEVEL: Salicylate Lvl: <7 mg/dL (ref 2.8–30.0)

## 2019-04-13 LAB — MAGNESIUM: Magnesium: 2.1 mg/dL (ref 1.7–2.4)

## 2019-04-13 LAB — I-STAT BETA HCG BLOOD, ED (MC, WL, AP ONLY): I-stat hCG, quantitative: <5 m[IU]/mL (ref <5)

## 2019-04-13 LAB — ACETAMINOPHEN LEVEL: Acetaminophen (Tylenol), Serum: <10 ug/mL — ABNORMAL LOW (ref 10–30)

## 2019-04-13 MED ORDER — SODIUM CHLORIDE 0.9 % IV BOLUS
1000.0000 mL | Freq: Once | INTRAVENOUS | Status: AC
  Administered 2019-04-13: 02:00:00 1000 mL via INTRAVENOUS

## 2019-04-13 MED ORDER — LORAZEPAM 2 MG/ML IJ SOLN
1.0000 mg | Freq: Once | INTRAMUSCULAR | Status: AC
  Administered 2019-04-13: 02:00:00 1 mg via INTRAVENOUS
  Filled 2019-04-13: qty 1

## 2019-04-13 NOTE — Discharge Instructions (Signed)
Rest and drink fluids today. Follow-up with your primary care doctor. Return here for any new/acute changes.

## 2019-04-13 NOTE — ED Provider Notes (Signed)
[facility] DEPT Provider Note   CSN: [id number] Arrival date & time: 04/13/19  0120     History   Chief Complaint Chief Complaint  Patient presents with  . THC Ingestion  . Spasms    HPI Bethany Robinson is a 28 y.o. female.   The history is provided by the patient and medical records.    28 y.o. F with hx of anxiety, migraine headache, depression, presenting to the ED for concern of "spasms".  Patient states she ate a THC gummy around midnight and since that time has been having "spasms" all over her body that make her jerk around.  States she has eaten these before and never had issues.  She got it from the same person as normal, does not feel like there was anything else in them.  She denies any other co-ingestion.  She does admit she feels very anxious which may be making it worse.  States if she tries to focus she can control it a little better.  Ambulatory back to treatment room from triage.  Past Medical History:  Diagnosis Date  . Anxiety   . Common migraine with intractable migraine 09/27/2017  . Depression   . Family history of BRCA2 gene positive   . Family history of breast cancer     Patient Active Problem List   Diagnosis Date Noted  . 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 Genetic testing 08/08/2018  . Family history of breast cancer   . Family history of BRCA2 gene positive   . Common migraine with intractable migraine 09/27/2017    Past Surgical History:  Procedure Laterality Date  . TONSILLECTOMY       OB History    Gravida  0   Para  0   Term  0   Preterm  0   AB  0   Living  0     SAB  0   TAB  0   Ectopic  0   Multiple  0   Live Births  0            Home Medications    Prior to Admission medications   Medication Sig Start Date End Date Taking? Authorizing Provider  BuPROPion HBr (APLENZIN) 348 MG TB24 Take 348 mg by mouth daily.    [provider]  Cetirizine HCl (ZYRTEC  PO) Take 10 mg by mouth daily.     [provider]  cloNIDine (CATAPRES) 0.1 MG tablet Take 0.1 mg by mouth daily.     [provider]  etonogestrel (NEXPLANON) 68 MG IMPL implant 1 each by Subdermal route once.    [provider]  naproxen (NAPROSYN) 500 MG tablet Take 1 tablet (500 mg total) by mouth 2 (two) times daily. 07/27/18   Jacqualine Mau, NP  traZODone (DESYREL) 50 MG tablet Take 50 mg by mouth at bedtime as needed for sleep.    [provider]  venlafaxine XR (EFFEXOR-XR) 37.5 MG 24 hr capsule TAKE 2 CAPSULES BY MOUTH DAILY WITH BREAKFAST. Patient taking differently: Take 37.5 mg by mouth daily with breakfast.  01/17/18   Kathrynn Ducking, MD    Family History Family History  Problem Relation Age of Onset  . Migraines Father   . BRCA 1/2 Mother        BRCA2 pos  . BRCA 1/2 Maternal Aunt        BRCA2 pos  . Breast cancer Maternal Grandmother   .  Breast cancer Other     Social History Social History   Tobacco Use  . Smoking status: Never Smoker  . Smokeless tobacco: Never Used  Substance Use Topics  . Alcohol use: Yes    Comment: socially  . Drug use: Yes    Comment: THC Gummies     Allergies   Hydrocodone and Topamax [topiramate]   Review of Systems Review of Systems  Musculoskeletal:       "spasms"  All other systems reviewed and are negative.    Physical Exam Updated Vital Signs BP (!) 135/106 (BP Location: Right Arm)   Pulse (!) 127   Temp 98.4 F (36.9 C) (Oral)   Resp (!) 23   Ht 5' 6"  (1.676 m)   Wt 95.3 kg   SpO2 98%   BMI 33.89 kg/m   Physical Exam Vitals signs and nursing note reviewed.  Constitutional:      Appearance: She is well-developed.  HENT:     Head: Normocephalic and atraumatic.  Eyes:     Conjunctiva/sclera: Conjunctivae normal.     Pupils: Pupils are equal, round, and reactive to light.  Neck:     Musculoskeletal: Normal range of motion.  Cardiovascular:     Rate and  Rhythm: Regular rhythm. Tachycardia present.     Heart sounds: Normal heart sounds.  Pulmonary:     Effort: Pulmonary effort is normal.     Breath sounds: Normal breath sounds.  Abdominal:     General: Bowel sounds are normal.     Palpations: Abdomen is soft.  Musculoskeletal: Normal range of motion.  Skin:    General: Skin is warm and dry.  Neurological:     Mental Status: She is alert and oriented to person, place, and time.     Comments: AAOx3, intermittently shaking/jerking movements during exam which appears very purposeful/intentional; this stops during exam and when asked to perform tasks; no focal numbness or weakness, ambulatory with steady gait  Psychiatric:        Mood and Affect: Mood is anxious.      ED Treatments / Results  Labs (all labs ordered are listed, but only abnormal results are displayed) Labs Reviewed  CBC WITH DIFFERENTIAL/PLATELET - Abnormal; Notable for the following components:      Result Value   WBC 11.3 (*)    Neutro Abs 9.4 (*)    All other components within normal limits  COMPREHENSIVE METABOLIC PANEL - Abnormal; Notable for the following components:   Glucose, Bld 115 (*)    All other components within normal limits  ACETAMINOPHEN LEVEL - Abnormal; Notable for the following components:   Acetaminophen (Tylenol), Serum <10 (*)    All other components within normal limits  MAGNESIUM  ETHANOL  SALICYLATE LEVEL  RAPID URINE DRUG SCREEN, HOSP PERFORMED  I-STAT BETA HCG BLOOD, ED ([facility], [facility], [facility] ONLY)    EKG None  Radiology No results found.  Procedures Procedures (including critical care time)  Medications Ordered in ED Medications  sodium chloride 0.9 % bolus 1,000 mL (0 mLs Intravenous Stopped 04/13/19 0235)  LORazepam (ATIVAN) injection 1 mg (1 mg Intravenous Given 04/13/19 0208)     Initial Impression / Assessment and Plan / ED Course  I have reviewed the triage vital signs and the nursing notes.  Pertinent labs & imaging  results that were available during my care of the patient were reviewed by me and considered in my medical decision making (see chart for details).  28 year old female here  with "spasms".  She ate THC Gummies around 10 PM and states she is felt weird since that time.  She has done this before without these issues.  She got these from her "usual person".  She denies any other coingestions.  She is tachycardic here and is displaying shaking/jerking movements during exam, however these seem intentional and purposeful.  When distracted were asked to perform tasks during exam these do not occur.  She does not have any focal neurologic deficits.  She does appear somewhat anxious.  Will give IV fluids and dose of Ativan.  Labs are pending.  5:22 AM Labs overall reassuring.  HR has trended down nicely during ED stay, 94 on my last re-check.  States she is feeling better here.  She is not having any further jerking/shaking movements.  Denies feeling spasms.  Neurologic exam remains non-focal.  Suspect symptoms related to THC ingestion.  She was not able to provide UDS here but denies any other co-ingestion.  Advised against illicit drug use going forward.  Close follow-up with PCP.  Return here for any new/acute changes.  Final Clinical Impressions(s) / ED Diagnoses   Final diagnoses:  Marijuana use    ED Discharge Orders    None       Larene Pickett, PA-C 04/13/19 0535    Rolland Porter, MD 04/13/19 [phone]

## 2019-04-13 NOTE — ED Triage Notes (Addendum)
Pt reports ingesting a THC gummy around 2200 and sts intermittent muscle spasms starting around 2300.  Sts spasms in different areas of her body, including hips and legs.  Denies pain.  Pt was able to walk to room w/o difficulty.      Sts "if I focus on something, it slows down or stops."

## 2019-04-17 ENCOUNTER — Other Ambulatory Visit: Payer: Self-pay

## 2019-04-18 ENCOUNTER — Encounter: Payer: Self-pay | Admitting: Family Medicine

## 2019-04-18 ENCOUNTER — Ambulatory Visit: Payer: BC Managed Care – PPO | Admitting: Family Medicine

## 2019-04-18 ENCOUNTER — Other Ambulatory Visit: Payer: Self-pay | Admitting: Family Medicine

## 2019-04-18 VITALS — BP 118/90 | HR 89 | Temp 98.0°F | Ht 66.0 in | Wt 212.8 lb

## 2019-04-18 DIAGNOSIS — G43809 Other migraine, not intractable, without status migrainosus: Secondary | ICD-10-CM | POA: Diagnosis not present

## 2019-04-18 DIAGNOSIS — F419 Anxiety disorder, unspecified: Secondary | ICD-10-CM

## 2019-04-18 DIAGNOSIS — R202 Paresthesia of skin: Secondary | ICD-10-CM

## 2019-04-18 DIAGNOSIS — R200 Anesthesia of skin: Secondary | ICD-10-CM | POA: Diagnosis not present

## 2019-04-18 MED ORDER — SUMATRIPTAN SUCCINATE 50 MG PO TABS: ORAL_TABLET | ORAL | 1 refills | Status: DC

## 2019-04-18 NOTE — Progress Notes (Signed)
Bethany Robinson is a 28 y.o. female  Chief Complaint  Patient presents with  . Follow-up    HPI: Bethany Robinson is a 28 y.o. female who was seen in ER on 8/16 for muscle spasms/twitching after ingestion of marijuana-laced gummy bear. Pt was given 1L IVF and 45m ativan IV x 1. Normal neuro exam and labs. Pt has taken these type of gummy bears before without issue.  Today, pt states she feels taking this has  "brought back previous neurologic symptoms" that she last experienced in winter of 2018 (she saw a neurologist at that time - Dr. WJannifer Franklinwith [facility]). Pt reports trouble with word-finding, shakiness and tingling in her fingers and hands, subjective weakness in her hands. She was tried on topamax with side effects, then gabapentin (09/2017-12/2017) without improvement in symptoms, and then effexor. MRI negative.  She has a h/o migraine headaches and has been having more frequent ones, almost daily for the past 3 weeks. These are usually Rt sided. + increased sensitivity to light and sound. No n/v. No vision changes. No lightheadedness. No neck pain.  Sleep is "ok". Pt has not been eating as well for the past few months. She states at times she is not hungry. She does not feel symptoms above are related to anxiety. She feels frustrated by the fact that she has symptoms but "no one can seem to figure out the cause".  Past Medical History:  Diagnosis Date  . Anxiety   . Common migraine with intractable migraine 09/27/2017  . Depression   . Family history of BRCA2 gene positive   . Family history of breast cancer     Past Surgical History:  Procedure Laterality Date  . TONSILLECTOMY      Social History   Socioeconomic History  . Marital status: Single    Spouse name: Not on file  . Number of children: Not on file  . Years of education: Not on file  . Highest education level: Not on file  Occupational History  . Not on file  Social Needs  . Financial resource strain: Not on file   . Food insecurity    Worry: Not on file    Inability: Not on file  . Transportation needs    Medical: Not on file    Non-medical: Not on file  Tobacco Use  . Smoking status: Never Smoker  . Smokeless tobacco: Never Used  Substance and Sexual Activity  . Alcohol use: Yes    Comment: socially  . Drug use: Yes    Comment: THC Gummies  . Sexual activity: Yes    Birth control/protection: Implant, None    Comment: 1st intercourse- 21, partners- greater than 10,   Lifestyle  . Physical activity    Days per week: Not on file    Minutes per session: Not on file  . Stress: Not on file  Relationships  . Social [place]: Not on file    Gets together: Not on file    Attends religious service: Not on file    Active member of club or organization: Not on file    Attends meetings of clubs or organizations: Not on file    Relationship status: Not on file  . Intimate partner violence    Fear of current or ex partner: Not on file    Emotionally abused: Not on file    Physically abused: Not on file    Forced sexual activity: Not on file  Other Topics Concern  . Not on file  Social History Narrative  . Not on file    Family History  Problem Relation Age of Onset  . Migraines Father   . BRCA 1/2 Mother        BRCA2 pos  . BRCA 1/2 Maternal Aunt        BRCA2 pos  . Breast cancer Maternal Grandmother   . Breast cancer Other      Immunization History  Administered Date(s) Administered  . Influenza Inj Mdck Quad Pf 05/31/2017  . Influenza,inj,Quad PF,6+ Mos 10/04/2018    Outpatient Encounter Medications as of 04/18/2019  Medication Sig  . buPROPion (WELLBUTRIN XL) 300 MG 24 hr tablet Take 300 mg by mouth daily after breakfast.  . Cetirizine HCl (ZYRTEC PO) Take 10 mg by mouth daily.   . cloNIDine (CATAPRES) 0.1 MG tablet Take 0.1 mg by mouth daily.  Marland Kitchen etonogestrel (NEXPLANON) 68 MG IMPL implant 1 each by Subdermal route once.  . venlafaxine XR (EFFEXOR-XR)  75 MG 24 hr capsule Take 75 mg by mouth daily after breakfast.  . [DISCONTINUED] venlafaxine XR (EFFEXOR-XR) 37.5 MG 24 hr capsule TAKE 2 CAPSULES BY MOUTH DAILY WITH BREAKFAST. (Patient not taking: No sig reported)   Facility-Administered Encounter Medications as of 04/18/2019  Medication  . gadopentetate dimeglumine (MAGNEVIST) injection 18 mL     ROS: Pertinent positives and negatives noted in HPI. Remainder of ROS non-contributory   Allergies  Allergen Reactions  . Hydrocodone Swelling  . Topamax [Topiramate]     Cognitive slowing    BP 118/90   Pulse 89   Temp 98 F (36.7 C) (Oral)   Ht 5' 6" (1.676 m)   Wt 212 lb 12.8 oz (96.5 kg)   SpO2 99%   BMI 34.35 kg/m   Physical Exam  Constitutional: She is oriented to person, place, and time. She appears well-developed and well-nourished. No distress.  Eyes: Pupils are equal, round, and reactive to light.  Neck: No thyromegaly present.  Cardiovascular: Normal rate and regular rhythm.  Pulmonary/Chest: Effort normal and breath sounds normal. No respiratory distress.  Musculoskeletal:        General: No edema.  Lymphadenopathy:    She has no cervical adenopathy.  Neurological: She is alert and oriented to person, place, and time. She has normal strength. No cranial nerve deficit. She exhibits normal muscle tone. Coordination normal.  Psychiatric: Her speech is normal and behavior is normal. Thought content normal. Her mood appears anxious.     A/P:  1. Anxiety 2. Other migraine without status migrainosus, not intractable 3. Numbness and tingling in both hands - migraines are not new but more frequent and at times more intense - other symptoms (fatigue, numbness and tingling, trouble with word finding) could be due to anxiety as she had same symptoms 1.5-2 yrs ago and had normal labs, neg MRI brain - CMP, CBC WNL; will check additional labs - TSH - B12 - VITAMIN D 25 Hydroxy (Vit-D Deficiency, Fractures); Future - pt  is concerned about symptoms and frustrated in lack of dx. She is not convinced sx related to anxiety and would like to see a different neurologist for eval - Ambulatory referral to Neurology - pt did not tolerate topamax previously, does not believe she has taken sumatriptan. Will send Rx to pharm. Discussed plan and reviewed medications with patient, including risks, benefits, and potential side effects. Pt expressed understand. All questions answered.   I personally spent 25 min with  the patient today and greater than 50% was spent in counseling, coordination of care, education

## 2019-04-18 NOTE — Addendum Note (Signed)
Addended by: Lynnea Ferrier on: 04/18/2019 01:59 PM   Modules accepted: Orders

## 2019-04-19 LAB — VITAMIN B12: Vitamin B-12: 316 pg/mL (ref 232–1245)

## 2019-04-19 LAB — TSH: TSH: 2.38 u[IU]/mL (ref 0.450–4.500)

## 2019-04-21 LAB — VITAMIN D 25 HYDROXY (VIT D DEFICIENCY, FRACTURES): Vit D, 25-Hydroxy: 11.6 ng/mL — ABNORMAL LOW (ref 30.0–100.0)

## 2019-04-21 LAB — SPECIMEN STATUS REPORT

## 2019-04-23 ENCOUNTER — Other Ambulatory Visit: Payer: Self-pay | Admitting: Family Medicine

## 2019-04-23 ENCOUNTER — Encounter: Payer: Self-pay | Admitting: Family Medicine

## 2019-04-23 DIAGNOSIS — E559 Vitamin D deficiency, unspecified: Secondary | ICD-10-CM

## 2019-04-23 MED ORDER — VITAMIN D (ERGOCALCIFEROL) 1.25 MG (50000 UNIT) PO CAPS: 50000.0000 [IU] | ORAL_CAPSULE | ORAL | 3 refills | Status: DC

## 2019-05-01 ENCOUNTER — Ambulatory Visit: Payer: BC Managed Care – PPO | Admitting: Obstetrics & Gynecology

## 2019-05-01 ENCOUNTER — Other Ambulatory Visit: Payer: Self-pay

## 2019-05-01 ENCOUNTER — Ambulatory Visit (INDEPENDENT_AMBULATORY_CARE_PROVIDER_SITE_OTHER): Payer: BC Managed Care – PPO

## 2019-05-01 DIAGNOSIS — Z1509 Genetic susceptibility to other malignant neoplasm: Secondary | ICD-10-CM | POA: Diagnosis not present

## 2019-05-01 DIAGNOSIS — Z975 Presence of (intrauterine) contraceptive device: Secondary | ICD-10-CM

## 2019-05-01 DIAGNOSIS — Z8041 Family history of malignant neoplasm of ovary: Secondary | ICD-10-CM

## 2019-05-01 DIAGNOSIS — N921 Excessive and frequent menstruation with irregular cycle: Secondary | ICD-10-CM | POA: Diagnosis not present

## 2019-05-01 DIAGNOSIS — Z1501 Genetic susceptibility to malignant neoplasm of breast: Secondary | ICD-10-CM | POA: Diagnosis not present

## 2019-05-01 NOTE — Progress Notes (Signed)
    Bethany Robinson October 07, 1990 [id number]        27 y.o.  G0P0000.  Single.  New job in [employer].  RP: Persistent BTB on Nexplanon and BrCa2 positive for Pelvic US  HPI: Resolved BTB on new Nexplanon x 01/2019.  No pelvic pain.   OB History  Gravida Para Term Preterm AB Living  0 0 0 0 0 0  SAB TAB Ectopic Multiple Live Births  0 0 0 0 0    Past medical history,surgical history, problem list, medications, allergies, family history and social history were all reviewed and documented in the EPIC chart.   Directed ROS with pertinent positives and negatives documented in the history of present illness/assessment and plan.  Exam:  There were no vitals filed for this visit. General appearance:  Normal  Pelvic US today: Anteverted uterus normal in size and shape with no myometrial mass.  Uterus measures 6.48 x 3.03 x 2.62 cm.  The endometrial lining is thin and normal at 1.72 mm with no mass or thickening seen.  Both ovaries are normal in size with normal follicular pattern.  A simple follicle is present on the right ovary measuring 2.3 x 2 cm.  No adnexal mass seen.  No free fluid in the posterior cul-de-sac.   Assessment/Plan:  28 y.o. G0  1. Breakthrough bleeding on Nexplanon Pelvic ultrasound findings reviewed with patient including an anteverted uterus which is normal in size and shape with an endometrial lining that is thin and normal at 1.72 mm and both ovaries normal in size and appearance.  No free fluid in the pelvis.  Patient reassured.  We will continue with new Nexplanon.  No breakthrough bleeding since insertion in June 2020.  2. BRCA2 positive Pelvic ultrasound normal including both ovaries that are normal in size and appearance.  No free fluid in the pelvis.  Patient reassured.  Counseling on above issues and coordination of care more than 50% for 15 minutes.  Princess Bruins MD, 5:02 PM 05/01/2019

## 2019-05-02 ENCOUNTER — Encounter: Payer: Self-pay | Admitting: Obstetrics & Gynecology

## 2019-05-14 ENCOUNTER — Encounter: Payer: Self-pay | Admitting: Neurology

## 2019-05-14 ENCOUNTER — Telehealth: Payer: Self-pay

## 2019-05-14 NOTE — Telephone Encounter (Signed)
Pt aware of notes below. Pt is going to speak with her insurance company and we will go from there

## 2019-05-14 NOTE — Telephone Encounter (Signed)
Yes they were medically necessary as pt was/is having numbness and tingling and deficiency of B12 and possibly Vit D can cause/contribute to these symptoms.

## 2019-05-14 NOTE — Telephone Encounter (Signed)
Was here for visit on 9/3 with u/s.

## 2019-05-14 NOTE — Telephone Encounter (Signed)
Dr. Loletha Grayer please advise  Copied from [place] [phone]. Topic: General - Other >> May 13, 2019  1:14 PM Carolyn Stare wrote: Pt call to say her insurance company sent a letter to her Mom whose insurance she is on asking if labs for b12 and vit d was medical necessary, please contact pt

## 2019-05-15 ENCOUNTER — Encounter: Payer: Self-pay | Admitting: Family Medicine

## 2019-05-18 NOTE — Progress Notes (Signed)
NEUROLOGY CONSULTATION NOTE  Bethany Robinson MRN: [id number] DOB: Aug 29, 1990  Referring provider: Letta Median, DO Primary care provider: Letta Median, DO  Reason for consult:  migraines  HISTORY OF PRESENT ILLNESS: Bethany Robinson is a 28 year old right-handed female with anxiety who presents for migraines.  History supplemented by prior neurologist and referring provider notes.   She has history of various neurologic symptoms since 2018, such as dizziness, numbness and tingling of hands feet, and legs, word-finding difficulty/stuttering and fatigue.  She has tremors in hand and has trouble with dexterity and fine-finger movements.  Symptoms are intermittent.  She had workup performed by a previous neurologist, which was negative.  MRI of brain with and without contrast from 10/02/17 was normal.  Symptoms subsequently resolved.  On 04/13/19, she presented to the ED for full-body twitching and muscle spasms that started after ingesting marijuana-laced gummy bear, which she has eaten in the past without complication.  She was treated with IVF and Ativan.  Following this event, she started to experience her previous symptoms, such as fatigue, paresthesias and word-finding difficulty.  B12 316; TSH 2.380.  Headaches have gotten worse as well.  She has several headaches: She reports ice-pick headaches:  Usually right-temple but may vary in location.  They are severe stabbing pain.  However she has a constant non-throbbing pain in various locations (right sided, behind head or behind eye).  Her typical migraines, since middle school, are a severe right occipital throbbing headache associated photophobia, phonophobia, sometimes nausea but no vomiting, visual disturbance or unilateral numbness and weakness.  They typically last 2 hours and occur maybe 2 to 3 times month.  No specific triggers.  They are relieved with analgesics and sleep.  Current NSAIDS:  Aleve, ibuprofen Current analgesics:   Excedrin Current triptans:  Rizatriptan 39m (recently prescribed by not yet taken) Current ergotamine:  none Current anti-emetic:  none Current muscle relaxants:  none Current anti-anxiolytic:  none Current sleep aide:  trazodone Current Antihypertensive medications:  clonidine Current Antidepressant medications:  Venlafaxine XR 735mdaily; bupropion 30035mtrazodone Current Anticonvulsant medications:  none Current anti-CGRP:  none Current Vitamins/Herbal/Supplements:  D, B12 Current Antihistamines/Decongestants:  Zyrtec Other therapy:  none Hormone/birth control:  Nexplanon  Past NSAIDS:  Naproxen 500m50mst analgesics:  Tylenol Past abortive triptans:  Sumatriptan 50mg4mt abortive ergotamine:  none Past muscle relaxants:  none Past anti-emetic:  none Past antihypertensive medications:  none Past antidepressant medications:  none Past anticonvulsant medications:  topiramate 75mg 18mewhat helpful but cognitive deficits), gabapentin 100mg i77m and 200mg in22mPast anti-CGRP:  none Past vitamins/Herbal/Supplements:  none Past antihistamines/decongestants:  none Other past therapies:  none  Caffeine:  1-2 cups of coffee or 1 soda 3 to 4 days a week Alcohol:  Socially (2 to 3 drinks a week) Smoker:  no Diet:  Needs to increase water intake Exercise:  no Depression:  yes; Anxiety:  yes Other pain:  general Sleep hygiene:  suboptimal Family history of headache:  Father (migraine with aura such as right sided body numbness)  04/13/19 LABS:  CBC with WBC 11.3, HGB 14.4, HCT 42.6, PLT 227; CMP with Na 139, K 4, Cl 105, CO2 24, glucose 115, BUN 17, Cr 0.86, t bili 0.4, ALP 62, AST 17, ALT 16   PAST MEDICAL HISTORY: Past Medical History:  Diagnosis Date  . Anxiety   . Common migraine with intractable migraine 09/27/2017  . Depression   . Family history of BRCA2 gene positive   .  Family history of breast cancer     PAST SURGICAL HISTORY: Past Surgical History:  Procedure  Laterality Date  . TONSILLECTOMY      MEDICATIONS: Current Outpatient Medications on File Prior to Visit  Medication Sig Dispense Refill  . buPROPion (WELLBUTRIN XL) 300 MG 24 hr tablet Take 300 mg by mouth daily after breakfast.    . Cetirizine HCl (ZYRTEC PO) Take 10 mg by mouth daily.     . cloNIDine (CATAPRES) 0.1 MG tablet Take 0.1 mg by mouth daily.    Marland Kitchen etonogestrel (NEXPLANON) 68 MG IMPL implant 1 each by Subdermal route once.    . SUMAtriptan (IMITREX) 50 MG tablet 1 tab po x 1 dose, may repeat x 1 in 1-2 hrs if headache persists 10 tablet 1  . venlafaxine XR (EFFEXOR-XR) 75 MG 24 hr capsule Take 75 mg by mouth daily after breakfast.    . Vitamin D, Ergocalciferol, (DRISDOL) 1.25 MG (50000 UT) CAPS capsule Take 1 capsule (50,000 Units total) by mouth every 7 (seven) days. 5 capsule 3   Current Facility-Administered Medications on File Prior to Visit  Medication Dose Route Frequency Provider Last Rate Last Dose  . gadopentetate dimeglumine (MAGNEVIST) injection 18 mL  18 mL Intravenous Once PRN Kathrynn Ducking, MD        ALLERGIES: Allergies  Allergen Reactions  . Hydrocodone Swelling  . Topamax [Topiramate]     Cognitive slowing    FAMILY HISTORY: Family History  Problem Relation Age of Onset  . Migraines Father   . BRCA 1/2 Mother        BRCA2 pos  . BRCA 1/2 Maternal Aunt        BRCA2 pos  . Breast cancer Maternal Grandmother   . Breast cancer Other     SOCIAL HISTORY: Social History   Socioeconomic History  . Marital status: Single    Spouse name: Not on file  . Number of children: Not on file  . Years of education: Not on file  . Highest education level: Not on file  Occupational History  . Occupation: cookout  Social Needs  . Financial resource strain: Not on file  . Food insecurity    Worry: Not on file    Inability: Not on file  . Transportation needs    Medical: Not on file    Non-medical: Not on file  Tobacco Use  . Smoking status: Never  Smoker  . Smokeless tobacco: Never Used  Substance and Sexual Activity  . Alcohol use: Yes    Comment: socially  . Drug use: Yes    Comment: THC Gummies  . Sexual activity: Yes    Birth control/protection: Implant, None    Comment: 1st intercourse- 21, partners- greater than 10,   Lifestyle  . Physical activity    Days per week: Not on file    Minutes per session: Not on file  . Stress: Not on file  Relationships  . Social Herbalist on phone: Not on file    Gets together: Not on file    Attends religious service: Not on file    Active member of club or organization: Not on file    Attends meetings of clubs or organizations: Not on file    Relationship status: Not on file  . Intimate partner violence    Fear of current or ex partner: Not on file    Emotionally abused: Not on file    Physically abused: Not on file  Forced sexual activity: Not on file  Other Topics Concern  . Not on file  Social History Narrative  . Not on file    REVIEW OF SYSTEMS: Constitutional: No fevers, chills, or sweats, no generalized fatigue, change in appetite Eyes: No visual changes, double vision, eye pain Ear, nose and throat: No hearing loss, ear pain, nasal congestion, sore throat Cardiovascular: No chest pain, palpitations Respiratory:  No shortness of breath at rest or with exertion, wheezes GastrointestinaI: No nausea, vomiting, diarrhea, abdominal pain, fecal incontinence Genitourinary:  No dysuria, urinary retention or frequency Musculoskeletal:  No neck pain, back pain Integumentary: No rash, pruritus, skin lesions Neurological: as above Psychiatric: No depression, insomnia, anxiety Endocrine: No palpitations, fatigue, diaphoresis, mood swings, change in appetite, change in weight, increased thirst Hematologic/Lymphatic:  No purpura, petechiae. Allergic/Immunologic: no itchy/runny eyes, nasal congestion, recent allergic reactions, rashes  PHYSICAL EXAM: Blood pressure  120/80, pulse 74, height 5' 6"  (1.676 m), weight 207 lb (93.9 kg), SpO2 95 %. General: No acute distress.  Patient appears well-groomed Head:  Normocephalic/atraumatic Eyes:  fundi examined but not visualized Neck: supple, no paraspinal tenderness, full range of motion Back: No paraspinal tenderness Heart: regular rate and rhythm Lungs: Clear to auscultation bilaterally. Vascular: No carotid bruits. Neurological Exam: Mental status: alert and oriented to person, place, and time, recent and remote memory intact, fund of knowledge intact, attention and concentration intact, speech fluent with infrequent mild stutter and not dysarthric, language intact. Cranial nerves: CN I: not tested CN II: pupils equal, round and reactive to light, visual fields intact CN III, IV, VI:  full range of motion, no nystagmus, no ptosis CN V: facial sensation intact CN VII: upper and lower face symmetric CN VIII: hearing intact CN IX, X: gag intact, uvula midline CN XI: sternocleidomastoid and trapezius muscles intact CN XII: tongue midline Bulk & Tone: normal, no fasciculations. Motor:  5/5 throughout.  Mildly tremulous in hands. Sensation:  Pinprick and vibration sensation intact. Deep Tendon Reflexes:  2+ throughout, toes downgoing.   Finger to nose testing:  Without dysmetria.   Heel to shin:  Without dysmetria.   Gait:  Normal station and stride.  Able to turn and tandem walk. Romberg negative  IMPRESSION: 1.  Constellation of neurologic symptoms:  Paresthesias, word-finding/stuttering/tremor.  Unclear etiology.  I don't think it is a demyelinating disease such as MS, as MRI of brain from last year was normal.  Possibly migraine related but cannot be sure as there is no pattern to these symptoms and have been fairly daily for an extended period of time.  Migraine would be diagnosis of exclusion.  Somatoform disorder considered as well. 2.  Migraine without aura, without status migrainosus, not  intractable, infrequent 3.  Primary stabbing headache, infrequent  PLAN: I will treat for migraine.  If these symptoms are migraine-related, then they should resolve with treatment. 1.  For preventative management, Emgality.  Will provide her sample today.  She is switching insurance now and her new insurance won't kick in for another month.  When it does, she will contact us and I can place a prescription. 2.  For abortive therapy, she will try rizatriptan for common migraine 3. For primary stabbing headache, consider melatonin 11m to 154mat bedtime 4.  Limit use of pain relievers to no more than 2 days out of week to prevent risk of rebound or medication-overuse headache. 5.  Keep headache diary 6.  Exercise, hydration, caffeine cessation, sleep hygiene, monitor for and avoid triggers  7.  Consider:  magnesium citrate 481m daily, riboflavin 4065mdaily, and coenzyme Q10 10037mhree times daily 8. Always keep in mind that currently taking a hormone or birth control may be a possible trigger or aggravating factor for migraine. 9. Follow up 4 months.   Thank you for allowing me to take part in the care of this patient.  AdaMetta ClinesO  CC: MarLetta MedianO

## 2019-05-20 ENCOUNTER — Ambulatory Visit: Payer: BC Managed Care – PPO | Admitting: Neurology

## 2019-05-20 ENCOUNTER — Encounter: Payer: Self-pay | Admitting: Neurology

## 2019-05-20 ENCOUNTER — Other Ambulatory Visit: Payer: Self-pay

## 2019-05-20 VITALS — BP 120/80 | HR 74 | Ht 66.0 in | Wt 207.0 lb

## 2019-05-20 DIAGNOSIS — G4485 Primary stabbing headache: Secondary | ICD-10-CM | POA: Diagnosis not present

## 2019-05-20 DIAGNOSIS — R202 Paresthesia of skin: Secondary | ICD-10-CM | POA: Diagnosis not present

## 2019-05-20 DIAGNOSIS — G43109 Migraine with aura, not intractable, without status migrainosus: Secondary | ICD-10-CM | POA: Diagnosis not present

## 2019-05-20 MED ORDER — EMGALITY 120 MG/ML ~~LOC~~ SOAJ: 120.0000 mL | Freq: Once | SUBCUTANEOUS | 0 refills | Status: AC

## 2019-05-20 NOTE — Patient Instructions (Addendum)
1.  For migraine preventative, we will start Emgality.  I will provide you a sample.  You must give yourself 2 injections (only for the initial dose).  After that, it is just one injection every 30 days.  Once your insurance kicks in, let us know so we can prescribe it. 2.  When you get a migraine, take rizatriptan 10mg .  May repeat dose once after 2 hours if needed, maximum 2 tablets in 24 hours. 3.  Limit use of pain relievers to no more than 2 days out of week to prevent risk of rebound or medication-overuse headache. 4.  Keep headache diary 5.  Exercise, hydration, caffeine cessation, sleep hygiene, monitor for and avoid triggers 6.  Consider:  magnesium citrate 400mg  daily, riboflavin 400mg  daily, and coenzyme Q10 100mg  three times daily 7. Always keep in mind that currently taking a hormone or birth control may be a possible trigger or aggravating factor for migraine. 8. To treat ice pick headache, consider taking melatonin 3mg  to 12mg  at bedtime 9. Follow up in 4 months.

## 2019-05-21 ENCOUNTER — Encounter: Payer: Self-pay | Admitting: Family Medicine

## 2019-05-24 IMAGING — MG DIGITAL SCREENING BILATERAL MAMMOGRAM WITH TOMO AND CAD
8 series · 8 of 24 positions shown · non-contrast
Comparison: None.

CLINICAL DATA: Screening. BRCA 2 gene mutation.

EXAM:
DIGITAL SCREENING BILATERAL MAMMOGRAM WITH TOMO AND CAD

[L MLO synth-2D]
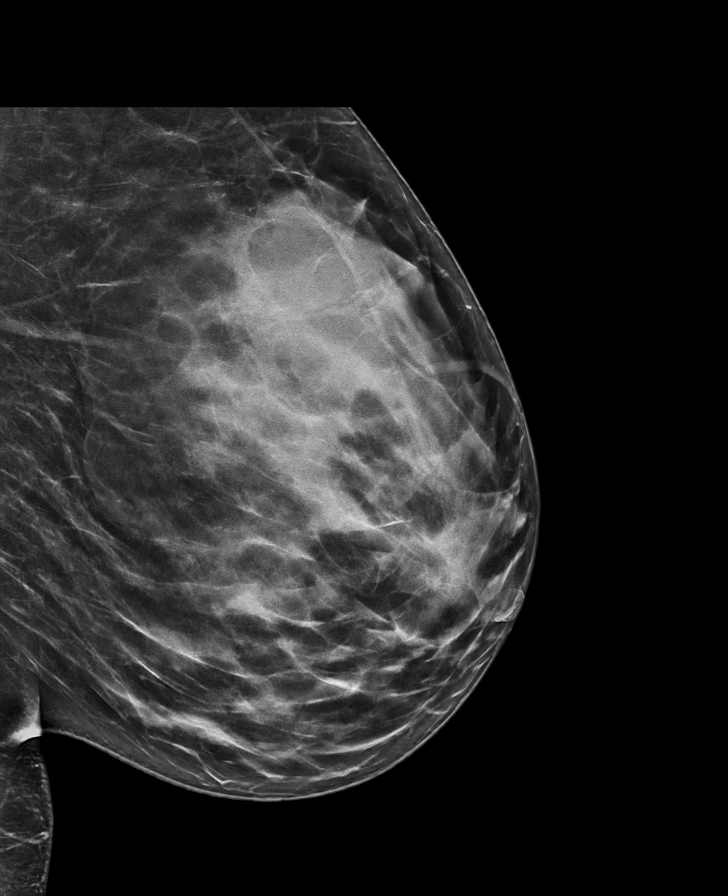

[L CC synth-2D]
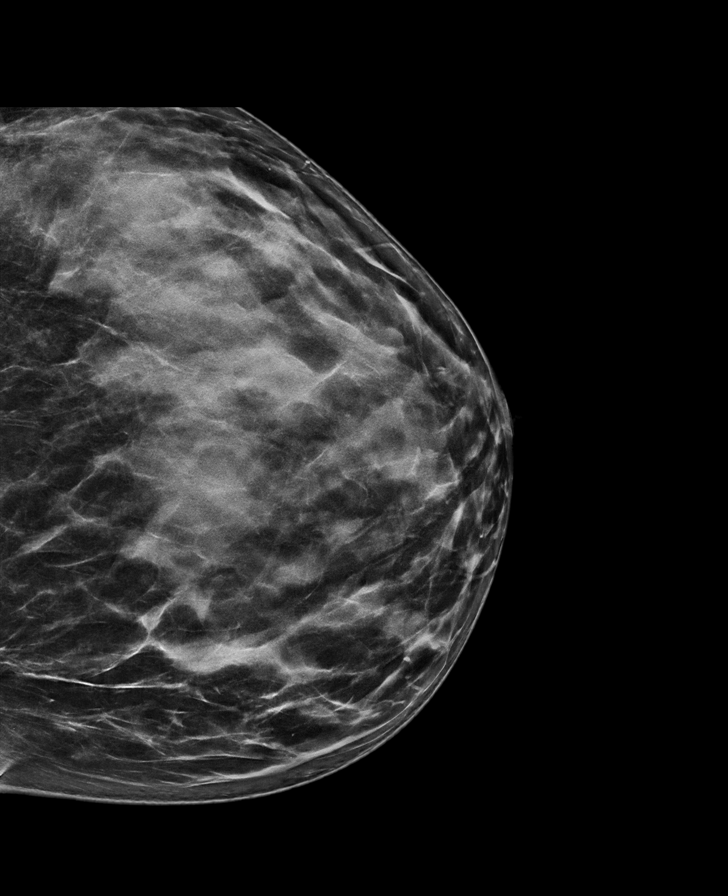

[R CC synth-2D]
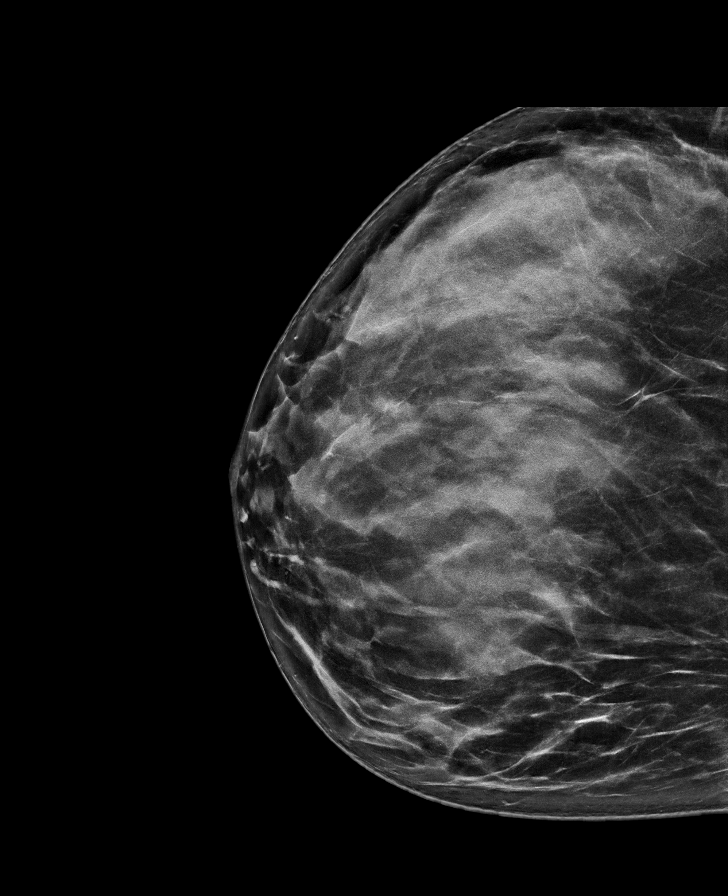

[R MLO synth-2D]
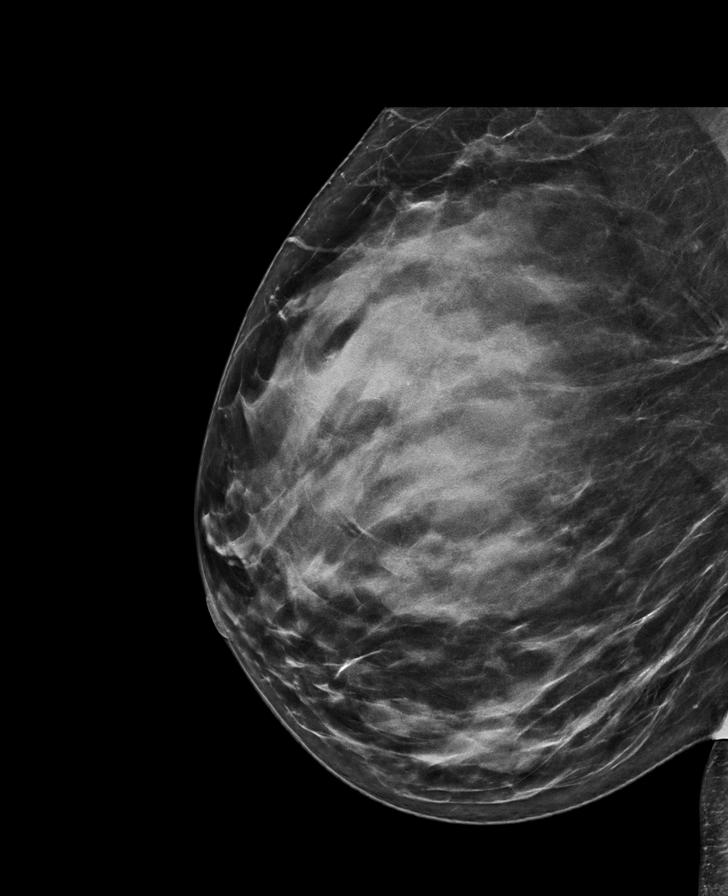

[L MLO tomo · tomo slice 39/78.0]
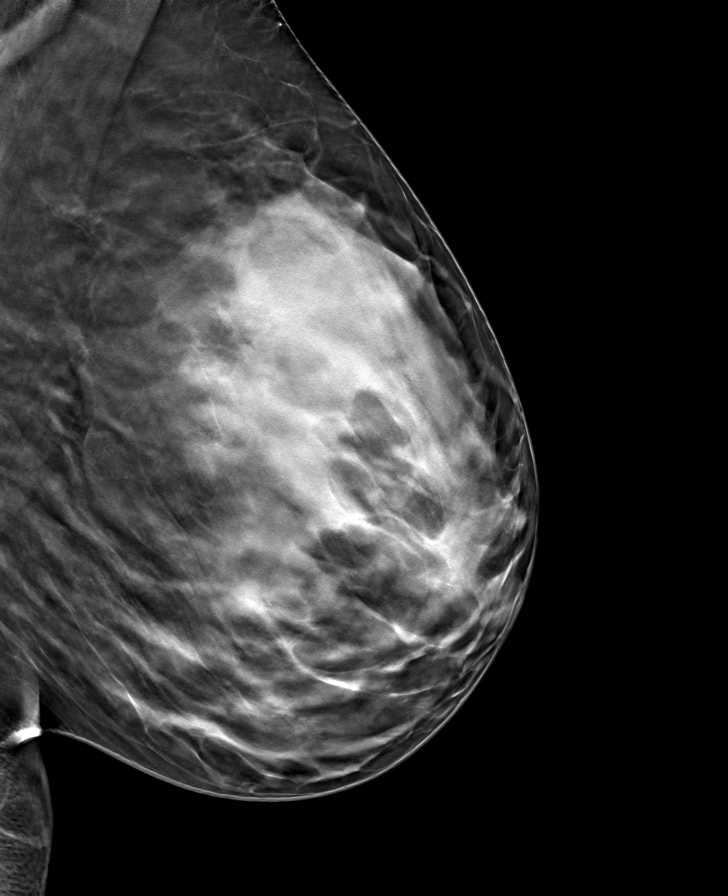

[R CC tomo · tomo slice 41/80.0]
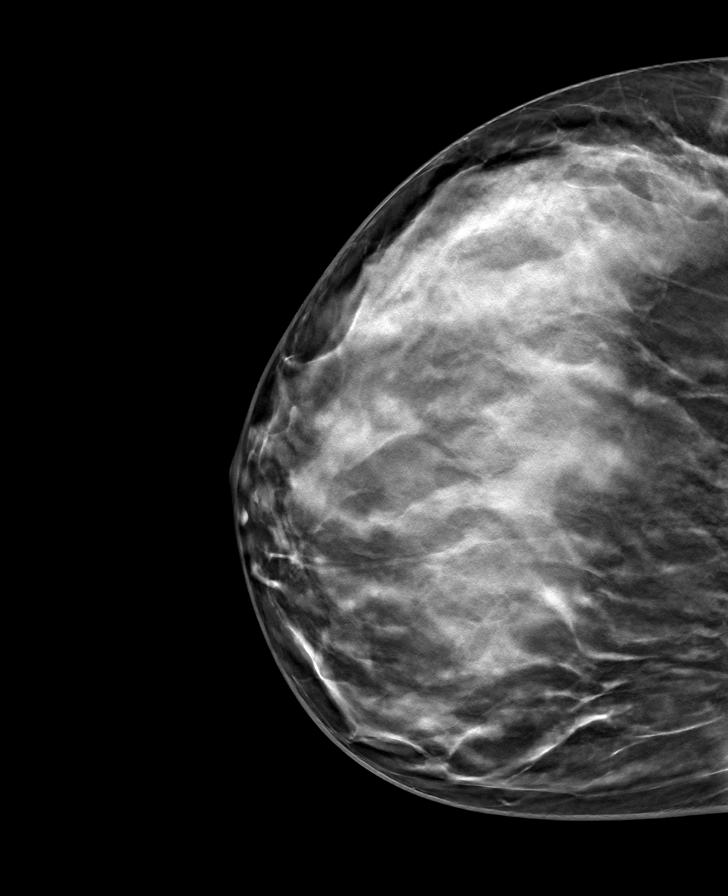

[R MLO tomo · tomo slice 39/77.0]
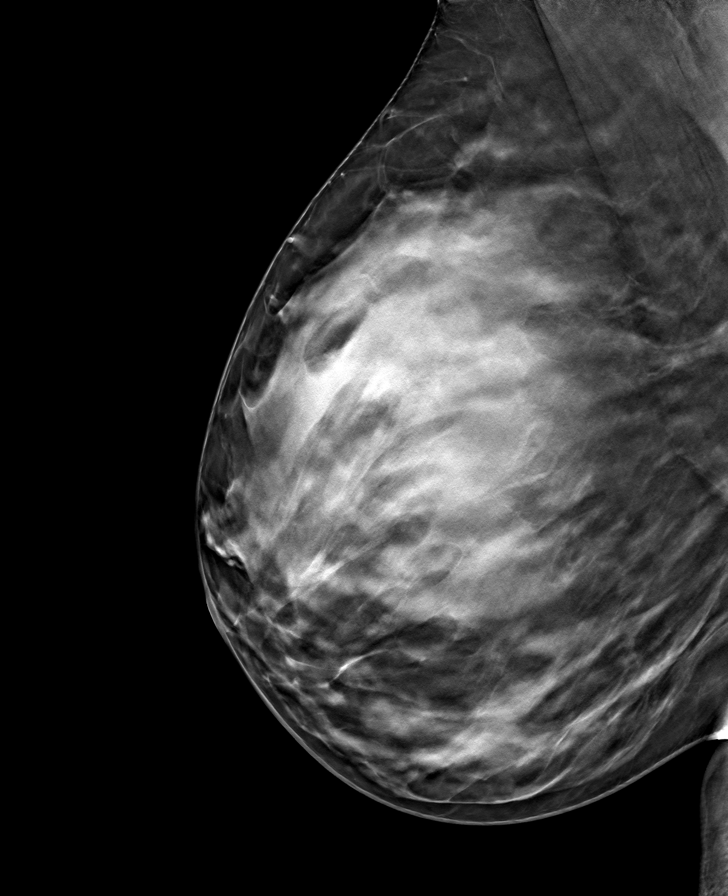

[L CC tomo · tomo slice 39/76.0]
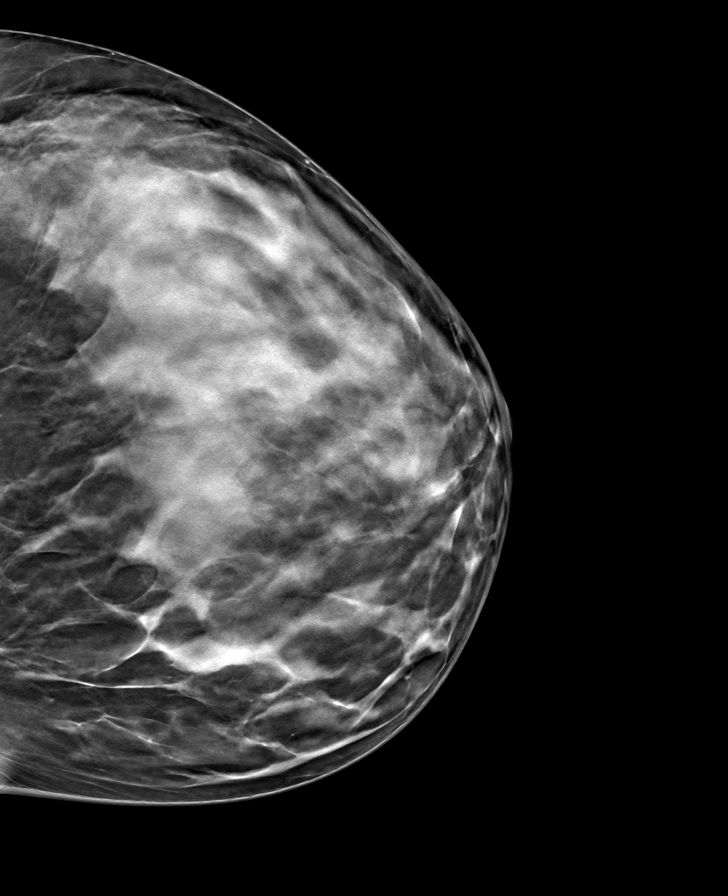

[8 of 24 positions shown; findings below may reference images not displayed]

ACR Breast Density Category d: The breast tissue is extremely dense,
which lowers the sensitivity of mammography.
FINDINGS: There are no findings suspicious for malignancy. Images were
processed with CAD.
IMPRESSION: No mammographic evidence of malignancy. A result letter of this
screening mammogram will be mailed directly to the patient.

RECOMMENDATION:
Annual screening.

High risk screening MRI.

BI-RADS CATEGORY  1: Negative.

## 2019-06-22 ENCOUNTER — Encounter: Payer: Self-pay | Admitting: Family Medicine

## 2019-07-15 ENCOUNTER — Other Ambulatory Visit: Payer: Self-pay | Admitting: Family Medicine

## 2019-07-15 DIAGNOSIS — E559 Vitamin D deficiency, unspecified: Secondary | ICD-10-CM

## 2019-08-13 ENCOUNTER — Encounter: Payer: Self-pay | Admitting: Family Medicine

## 2019-08-13 ENCOUNTER — Telehealth (INDEPENDENT_AMBULATORY_CARE_PROVIDER_SITE_OTHER): Payer: 59 | Admitting: Family Medicine

## 2019-08-13 VITALS — Ht 66.0 in | Wt 209.0 lb

## 2019-08-13 DIAGNOSIS — J4520 Mild intermittent asthma, uncomplicated: Secondary | ICD-10-CM | POA: Diagnosis not present

## 2019-08-13 MED ORDER — ALBUTEROL SULFATE HFA 108 (90 BASE) MCG/ACT IN AERS: 2.0000 | INHALATION_SPRAY | Freq: Four times a day (QID) | RESPIRATORY_TRACT | 2 refills | Status: DC | PRN

## 2019-08-13 NOTE — Progress Notes (Signed)
Virtual Visit via Video Note  I connected with Bethany Robinson on 08/13/19 at  4:00 PM EST by a video enabled telemedicine application and verified that I am speaking with the correct person using two identifiers. Location patient: home Location provider: work or home office Persons participating in the virtual visit: patient, provider  I discussed the limitations of evaluation and management by telemedicine and the availability of in person appointments. The patient expressed understanding and agreed to proceed.  Chief Complaint  Patient presents with  . Asthma    had a asthma flare on x sunday while exercising that it took an hour for her to regain regular breathing  . Shortness of Breath    more so in the AM, has tried an OTC inhaler  . Chest Pain     HPI: Bethany Robinson is a 28 y.o. female who complains of increased asthma symptoms in the past year. She notes chest tightness that is mild and manageable, more noticeable in the AM. She states on Sunday while exercising it took an hour to get her breathing back to normal. She was doing circuit training and had to stop less than 1/2 way thru due to SOB. No fever, chills, runny nose, stuffy nose, cough, PND.   Past Medical History:  Diagnosis Date  . Anxiety   . Common migraine with intractable migraine 09/27/2017  . Depression   . Family history of BRCA2 gene positive   . Family history of breast cancer     Past Surgical History:  Procedure Laterality Date  . TONSILLECTOMY      Family History  Problem Relation Age of Onset  . Migraines Father   . BRCA 1/2 Mother        BRCA2 pos  . BRCA 1/2 Maternal Aunt        BRCA2 pos  . Breast cancer Maternal Grandmother   . Depression Paternal Grandmother   . Heart failure Paternal Grandmother   . Crohn's disease Paternal Grandmother   . CVA Paternal Grandmother   . Other Paternal Grandmother        CREST   . Sjogren's syndrome Paternal Grandmother   . Breast cancer Other   .  Sjogren's syndrome Paternal Great-grandmother   . Heart attack Maternal Great-grandmother 15  . Hypertension Maternal Great-grandmother   . Bladder Cancer Maternal Great-grandmother     Social History   Tobacco Use  . Smoking status: Never Smoker  . Smokeless tobacco: Never Used  Substance Use Topics  . Alcohol use: Yes    Comment: socially  . Drug use: Yes    Comment: THC Gummies     Current Outpatient Medications:  .  BuPROPion HBr (APLENZIN) 348 MG TB24, Take 1 tablet by mouth daily., Disp: , Rfl:  .  Cetirizine HCl (ZYRTEC PO), Take 10 mg by mouth daily. , Disp: , Rfl:  .  cloNIDine (CATAPRES) 0.1 MG tablet, Take 0.1 mg by mouth daily., Disp: , Rfl:  .  etonogestrel (NEXPLANON) 68 MG IMPL implant, 1 each by Subdermal route once., Disp: , Rfl:  .  Vitamin D, Ergocalciferol, (DRISDOL) 1.25 MG (50000 UT) CAPS capsule, TAKE 1 CAPSULE BY MOUTH EVERY 7 (SEVEN) DAYS., Disp: 15 capsule, Rfl: 1 .  albuterol (VENTOLIN HFA) 108 (90 Base) MCG/ACT inhaler, Inhale 2 puffs into the lungs every 6 (six) hours as needed for wheezing or shortness of breath., Disp: 8 g, Rfl: 2 .  rizatriptan (MAXALT) 10 MG tablet, Take 10 mg by mouth as needed  for migraine. May repeat in 2 hours if needed, Disp: , Rfl:  .  SUMAtriptan (IMITREX) 50 MG tablet, 1 tab po x 1 dose, may repeat x 1 in 1-2 hrs if headache persists (Patient not taking: Reported on 05/20/2019), Disp: 10 tablet, Rfl: 1 No current facility-administered medications for this visit.  Facility-Administered Medications Ordered in Other Visits:  .  gadopentetate dimeglumine (MAGNEVIST) injection 18 mL, 18 mL, Intravenous, Once PRN, Kathrynn Ducking, MD  Allergies  Allergen Reactions  . Hydrocodone Swelling  . Topamax [Topiramate]     Cognitive slowing    ROS: See pertinent positives and negatives per HPI.   EXAM:  VITALS per patient if applicable: Ht 5' 6"  (1.676 m)   Wt 209 lb (94.8 kg)   BMI 33.73 kg/m    GENERAL: alert, oriented,  appears well and in no acute distress  HEENT: atraumatic, conjunctiva clear, no obvious abnormalities on inspection of external nose and ears  NECK: normal movements of the head and neck  LUNGS: on inspection no signs of respiratory distress, breathing rate appears normal, no obvious gross SOB, gasping or wheezing, no conversational dyspnea  CV: no obvious cyanosis  MS: moves all visible extremities without noticeable abnormality  PSYCH/NEURO: pleasant and cooperative, speech and thought processing grossly intact   ASSESSMENT AND PLAN: 1. Mild intermittent asthma without complication Rx: - albuterol (VENTOLIN HFA) 108 (90 Base) MCG/ACT inhaler; Inhale 2 puffs into the lungs every 6 (six) hours as needed for wheezing or shortness of breath.  Dispense: 8 g; Refill: 2 - advised pt to track symptoms and inhaler usage. If she finds she is using it more than 2x/wk, I would like her to f/u so we can discuss daily maintenance inhaler at least for a period of time. She is agreeable to this Discussed plan and reviewed medications with patient, including risks, benefits, and potential side effects. Pt expressed understand. All questions answered.    I discussed the assessment and treatment plan with the patient. The patient was provided an opportunity to ask questions and all were answered. The patient agreed with the plan and demonstrated an understanding of the instructions.   The patient was advised to call back or seek an in-person evaluation if the symptoms worsen or if the condition fails to improve as anticipated.   Letta Median, DO

## 2019-09-03 ENCOUNTER — Encounter: Payer: Self-pay | Admitting: Family Medicine

## 2019-09-03 DIAGNOSIS — J4530 Mild persistent asthma, uncomplicated: Secondary | ICD-10-CM

## 2019-09-03 DIAGNOSIS — L819 Disorder of pigmentation, unspecified: Secondary | ICD-10-CM

## 2019-09-03 DIAGNOSIS — R200 Anesthesia of skin: Secondary | ICD-10-CM

## 2019-09-03 DIAGNOSIS — R209 Unspecified disturbances of skin sensation: Secondary | ICD-10-CM

## 2019-09-03 MED ORDER — BECLOMETHASONE DIPROP HFA 40 MCG/ACT IN AERB: 1.0000 | INHALATION_SPRAY | Freq: Two times a day (BID) | RESPIRATORY_TRACT | 2 refills | Status: DC

## 2019-09-08 ENCOUNTER — Other Ambulatory Visit: Payer: Self-pay

## 2019-09-08 ENCOUNTER — Other Ambulatory Visit (INDEPENDENT_AMBULATORY_CARE_PROVIDER_SITE_OTHER): Payer: 59

## 2019-09-08 DIAGNOSIS — R209 Unspecified disturbances of skin sensation: Secondary | ICD-10-CM

## 2019-09-08 DIAGNOSIS — R200 Anesthesia of skin: Secondary | ICD-10-CM

## 2019-09-08 DIAGNOSIS — L819 Disorder of pigmentation, unspecified: Secondary | ICD-10-CM | POA: Diagnosis not present

## 2019-09-09 LAB — C-REACTIVE PROTEIN: CRP: 3 mg/L (ref 0–10)

## 2019-09-09 LAB — ANA: Anti Nuclear Antibody (ANA): NEGATIVE

## 2019-09-09 LAB — SEDIMENTATION RATE: Sed Rate: 5 mm/hr (ref 0–32)

## 2019-09-09 LAB — ANTI-DNA ANTIBODY, DOUBLE-STRANDED: dsDNA Ab: 1 IU/mL (ref 0–9)

## 2019-09-09 LAB — ANTI-SCLERODERMA ANTIBODY: Scleroderma (Scl-70) (ENA) Antibody, IgG: <0.2 AI (ref 0.0–0.9)

## 2019-09-12 ENCOUNTER — Encounter: Payer: Self-pay | Admitting: Family Medicine

## 2019-09-15 NOTE — Telephone Encounter (Signed)
FYI

## 2019-09-17 MED ORDER — DULOXETINE HCL 30 MG PO CPEP: ORAL_CAPSULE | ORAL | 1 refills | Status: DC

## 2019-09-24 MED ORDER — DULOXETINE HCL 30 MG PO CPEP: ORAL_CAPSULE | ORAL | 1 refills | Status: DC

## 2019-09-29 ENCOUNTER — Encounter: Payer: Self-pay | Admitting: Neurology

## 2019-09-29 NOTE — Progress Notes (Signed)
Virtual Visit via Video Note The purpose of this virtual visit is to provide medical care while limiting exposure to the novel coronavirus.    Consent was obtained for video visit:  Yes.   Answered questions that patient had about telehealth interaction:  Yes.   I discussed the limitations, risks, security and privacy concerns of performing an evaluation and management service by telemedicine. I also discussed with the patient that there may be a patient responsible charge related to this service. The patient expressed understanding and agreed to proceed.  Pt location: Home Physician Location: office Name of referring provider:  Ronnald Nian, DO I connected with Pilar Plate at patients initiation/request on 09/30/2019 at  3:30 PM EST by video enabled telemedicine application and verified that I am speaking with the correct person using two identifiers. Pt MRN:  [id number] Pt DOB:  [birth year] Video Participants:  Pilar Plate   History of Present Illness:  Bethany Robinson is a 29 year old right-handed female with anxiety who follows up for migraine with aura.  UPDATE: Seen in September.  At that time, I wanted to start her on Emgality and provided her a sample to initiate the treatment.  However, she had trouble with her insurance and never got around to pursuing continued prescription.. Intensity:  4/10 Duration:  2 hours Frequency:  12 to 15 days in January Rescue therapy:  Ibuprofen, rarely rizatriptan Current NSAIDS:  Aleve, ibuprofen Current analgesics:  Excedrin Current triptans:  Rizatriptan 3m Current ergotamine:  none Current anti-emetic:  none Current muscle relaxants:  none Current anti-anxiolytic:  none Current sleep aide:  trazodone Current Antihypertensive medications:  clonidine Current Antidepressant medications:  Cymbalta 667m bupropion 300106mdoxepin; trazodone Current Anticonvulsant medications:  none Current anti-CGRP:  Emgality Current  Vitamins/Herbal/Supplements:  D, B12 Current Antihistamines/Decongestants:  Zyrtec Other therapy:  none Hormone/birth control:  Nexplanon  Caffeine:  1-2 cups of coffee or 1 soda 3 to 4 days a week Alcohol:  Socially (2 to 3 drinks a week) Smoker:  no Diet:  Needs to increase water intake Exercise:  no Depression:  yes; Anxiety:  yes Other pain:  general Sleep hygiene:  suboptimal  HISTORY: She has history of various neurologic symptoms since 2018, such as dizziness, numbness and tingling of hands feet, and legs, word-finding difficulty/stuttering and fatigue.  She has tremors in hand and has trouble with dexterity and fine-finger movements.  Symptoms are intermittent.  She had workup performed by a previous neurologist, which was negative.  MRI of brain with and without contrast from 10/02/17 was normal.  Symptoms subsequently resolved.  On 04/13/19, she presented to the ED for full-body twitching and muscle spasms that started after ingesting marijuana-laced gummy bear, which she has eaten in the past without complication.  She was treated with IVF and Ativan.  Following this event, she started to experience her previous symptoms, such as fatigue, paresthesias and word-finding difficulty.  B12 316; TSH 2.380.  Headaches have gotten worse as well.  She has several headaches: She reports ice-pick headaches:  Usually right-temple but may vary in location.  They are severe stabbing pain.  However she has a constant non-throbbing pain in various locations (right sided, behind head or behind eye).  Her typical migraines, since middle school, are a severe right occipital throbbing headache associated photophobia, phonophobia, sometimes nausea but no vomiting, visual disturbance or unilateral numbness and weakness.  They typically last 2 hours and occur maybe 2 to 3 times month.  No specific triggers.  They are relieved with analgesics and sleep.   Past NSAIDS:  Naproxen 583m Past analgesics:   Tylenol Past abortive triptans:  Sumatriptan 59mPast abortive ergotamine:  none Past muscle relaxants:  none Past anti-emetic:  none Past antihypertensive medications:  none Past antidepressant medications:  venlafaxine XR 7535mast anticonvulsant medications:  topiramate 51m4momewhat helpful but cognitive deficits), gabapentin 100mg1mAM and 200mg 38mM Past anti-CGRP:  none Past vitamins/Herbal/Supplements:  none Past antihistamines/decongestants:  none Other past therapies:  none   Family history of headache:  Father (migraine with aura such as right sided body numbness)  Past Medical History: Past Medical History:  Diagnosis Date  . Anxiety   . Common migraine with intractable migraine 09/27/2017  . Depression   . Family history of BRCA2 gene positive   . Family history of breast cancer     Medications: Outpatient Encounter Medications as of 09/30/2019  Medication Sig  . albuterol (VENTOLIN HFA) 108 (90 Base) MCG/ACT inhaler Inhale 2 puffs into the lungs every 6 (six) hours as needed for wheezing or shortness of breath.  . beclomethasone (QVAR) 40 MCG/ACT inhaler Inhale 1 puff into the lungs 2 (two) times daily.  . BuPROPion HBr (APLENZIN) 348 MG TB24 Take 1 tablet by mouth daily.  . Cetirizine HCl (ZYRTEC PO) Take 10 mg by mouth daily.   . cloNIDine (CATAPRES) 0.1 MG tablet Take 0.1 mg by mouth daily.  . DULoxetine (CYMBALTA) 30 MG capsule 1 cap po daily x 1 week then increase to 2 caps po daily  . etonogestrel (NEXPLANON) 68 MG IMPL implant 1 each by Subdermal route once.  . rizatriptan (MAXALT) 10 MG tablet Take 10 mg by mouth as needed for migraine. May repeat in 2 hours if needed  . Vitamin D, Ergocalciferol, (DRISDOL) 1.25 MG (50000 UT) CAPS capsule TAKE 1 CAPSULE BY MOUTH EVERY 7 (SEVEN) DAYS.   Facility-Administered Encounter Medications as of 09/30/2019  Medication  . gadopentetate dimeglumine (MAGNEVIST) injection 18 mL    Allergies: Allergies  Allergen  Reactions  . Hydrocodone Swelling  . Topamax [Topiramate]     Cognitive slowing    Family History: Family History  Problem Relation Age of Onset  . Migraines Father   . BRCA 1/2 Mother        BRCA2 pos  . BRCA 1/2 Maternal Aunt        BRCA2 pos  . Breast cancer Maternal Grandmother   . Depression Paternal Grandmother   . Heart failure Paternal Grandmother   . Crohn's disease Paternal Grandmother   . CVA Paternal Grandmother   . Other Paternal Grandmother        CREST   . Sjogren's syndrome Paternal Grandmother   . Breast cancer Other   . Sjogren's syndrome Paternal Great-grandmother   . Heart attack Maternal Great-grandmother 70  . 51pertension Maternal Great-grandmother   . Bladder Cancer Maternal Great-grandmother     Social History: Social History   Socioeconomic History  . Marital status: Single    Spouse name: Not on file  . Number of children: 0  . Years of education: 18  . 55ghest education level: Not on file  Occupational History  . Occupation: cookout  Tobacco Use  . Smoking status: Never Smoker  . Smokeless tobacco: Never Used  Substance and Sexual Activity  . Alcohol use: Yes    Comment: socially  . Drug use: Yes    Comment: THC Gummies  . Sexual activity: Yes    Birth  control/protection: Implant, None    Comment: 1st intercourse- 21, partners- greater than 10,   Other Topics Concern  . Not on file  Social History Narrative   Right handed   Apartment lives in   Lives alone   No kids   Caffeine 2 cups of coffee a day    Social Determinants of Health   Financial Resource Strain:   . Difficulty of Paying Living Expenses: Not on file  Food Insecurity:   . Worried About Charity fundraiser in the Last Year: Not on file  . Ran Out of Food in the Last Year: Not on file  Transportation Needs:   . Lack of Transportation (Medical): Not on file  . Lack of Transportation (Non-Medical): Not on file  Physical Activity:   . Days of Exercise per Week:  Not on file  . Minutes of Exercise per Session: Not on file  Stress:   . Feeling of Stress : Not on file  Social Connections:   . Frequency of Communication with Friends and Family: Not on file  . Frequency of Social Gatherings with Friends and Family: Not on file  . Attends Religious Services: Not on file  . Active Member of Clubs or Organizations: Not on file  . Attends Archivist Meetings: Not on file  . Marital Status: Not on file  Intimate Partner Violence:   . Fear of Current or Ex-Partner: Not on file  . Emotionally Abused: Not on file  . Physically Abused: Not on file  . Sexually Abused: Not on file    Observations/Objective:   Height 5' 6"  (1.676 m), weight 215 lb (97.5 kg). No acute distress.  Alert and oriented.  Speech fluent and not dysarthric.  Language intact.  Eyes orthophoric on primary gaze.  Face symmetric.  Assessment and Plan:   1.   Migraine without aura, without status migrainosus, not intractable 3.  Primary stabbing headache, infrequent.  1.  For preventative management, will restart Emgality 2.  For abortive therapy, ibuprofen or rizatriptan 88m 3.  Limit use of pain relievers to no more than 2 days out of week to prevent risk of rebound or medication-overuse headache. 4.  Keep headache diary 5.   Follow up 5 months.   Follow Up Instructions:    -I discussed the assessment and treatment plan with the patient. The patient was provided an opportunity to ask questions and all were answered. The patient agreed with the plan and demonstrated an understanding of the instructions.   The patient was advised to call back or seek an in-person evaluation if the symptoms worsen or if the condition fails to improve as anticipated.    ADudley Major DO

## 2019-09-30 ENCOUNTER — Telehealth (INDEPENDENT_AMBULATORY_CARE_PROVIDER_SITE_OTHER): Payer: 59 | Admitting: Neurology

## 2019-09-30 ENCOUNTER — Encounter: Payer: Self-pay | Admitting: Neurology

## 2019-09-30 ENCOUNTER — Other Ambulatory Visit: Payer: Self-pay

## 2019-09-30 VITALS — Ht 66.0 in | Wt 215.0 lb

## 2019-09-30 DIAGNOSIS — G43109 Migraine with aura, not intractable, without status migrainosus: Secondary | ICD-10-CM | POA: Diagnosis not present

## 2019-09-30 MED ORDER — EMGALITY 120 MG/ML ~~LOC~~ SOAJ: 120.0000 mg | SUBCUTANEOUS | 11 refills | Status: DC

## 2019-09-30 MED ORDER — EMGALITY 120 MG/ML ~~LOC~~ SOAJ: 240.0000 mg | Freq: Once | SUBCUTANEOUS | 0 refills | Status: AC

## 2019-09-30 NOTE — Patient Instructions (Signed)
1.  I will restart you on Emgality.  For the first dose, you will inject yourself with 2 pens but then it is just one pen every 28 days thereafter.    To activate the care, either: 1.  Visit website [email or web address] OR 2.  Call [phone]  OR 3.  Text "activate" to [phone]  Once activated, present the following information from your Emgality savings card:  RxBIN: [id number] RxPCN: [id number] RxGRP: [id number] RxID:  [id number]    2.  Follow up in 5 months.

## 2019-10-01 ENCOUNTER — Other Ambulatory Visit: Payer: Self-pay | Admitting: Family Medicine

## 2019-10-01 DIAGNOSIS — Z1231 Encounter for screening mammogram for malignant neoplasm of breast: Secondary | ICD-10-CM

## 2019-10-03 ENCOUNTER — Other Ambulatory Visit: Payer: Self-pay

## 2019-10-03 ENCOUNTER — Ambulatory Visit
Admission: RE | Admit: 2019-10-03 | Discharge: 2019-10-03 | Disposition: A | Discharge status: Home / Self Care | Payer: 59 | Source: Ambulatory Visit

## 2019-10-03 DIAGNOSIS — Z1231 Encounter for screening mammogram for malignant neoplasm of breast: Secondary | ICD-10-CM

## 2019-10-07 ENCOUNTER — Telehealth: Payer: Self-pay | Admitting: Family Medicine

## 2019-10-07 NOTE — Telephone Encounter (Signed)
Patient is calling and wanted to speak to someone regarding medication. CB is [phone]

## 2019-10-08 NOTE — Telephone Encounter (Signed)
I spoke with pt in regarding to her cymbalta.  Pt said that since she increased her dosage, she has been having chills, feeling shaky and noticed an fluctuation of her temperature.  Pt said that her temp has been fluctuating between 97.7 up 99.1.  Please advise.

## 2019-10-08 NOTE — Telephone Encounter (Signed)
Please advise pt to return to 30mg  tab daily and if symptoms do not improve/resolve then stop med and let me know.

## 2019-10-08 NOTE — Telephone Encounter (Signed)
I informed pt of Dr. Renaye Rakers recommendations.

## 2019-10-20 ENCOUNTER — Other Ambulatory Visit: Payer: Self-pay

## 2019-10-20 NOTE — Telephone Encounter (Signed)
Pharmacy requesting a new script sent in for clarification.  Please see TE

## 2019-10-22 NOTE — Telephone Encounter (Signed)
Please find out from pt how she is doing on cymbalta 30mg  daily? Have side effects she noted with 60mg  daily resolved? Also is she taking doxepin and if so what dose and who prescribes this? There can be issues taking both meds together, especially depending on the dose.

## 2019-10-23 NOTE — Telephone Encounter (Signed)
Patient is returning the call. CB is [phone]

## 2019-10-23 NOTE — Telephone Encounter (Signed)
lm

## 2019-10-23 NOTE — Telephone Encounter (Signed)
I spoke with pt and she informed me that her symptoms have resolved since decrease in Cymbalta, but she is worried about it messing with her brain chemistry,  pt explains that her depression has gotten worse since she started the Cymbalta.  Pt also said that she was taking Doxepin 50mg  but now is taking 75mg  due to decrease in Cymbalta, 30mg .  Please advise.

## 2019-10-23 NOTE — Telephone Encounter (Signed)
Left message on voicemail to call office.  

## 2019-10-24 NOTE — Telephone Encounter (Signed)
I informed pt of message from Dr. Salena Saner.  Pt verbalized understanding.

## 2019-10-24 NOTE — Telephone Encounter (Signed)
Pt should stop the cymbalta and cont on doxepin 75mg  daily. That may need to be increased as typical dosing for depression and anxiety is 150-300mg  daily at bedtime. She could discuss this with prescribing physician.

## 2019-12-04 ENCOUNTER — Other Ambulatory Visit: Payer: Self-pay

## 2019-12-05 ENCOUNTER — Ambulatory Visit (INDEPENDENT_AMBULATORY_CARE_PROVIDER_SITE_OTHER): Payer: 59 | Admitting: Family Medicine

## 2019-12-05 ENCOUNTER — Encounter: Payer: Self-pay | Admitting: Family Medicine

## 2019-12-05 ENCOUNTER — Encounter: Payer: Self-pay | Admitting: Neurology

## 2019-12-05 VITALS — BP 118/80 | HR 98 | Temp 98.4°F | Ht 66.0 in | Wt 226.4 lb

## 2019-12-05 DIAGNOSIS — R200 Anesthesia of skin: Secondary | ICD-10-CM | POA: Diagnosis not present

## 2019-12-05 MED ORDER — PREGABALIN 75 MG PO CAPS: 75.0000 mg | ORAL_CAPSULE | Freq: Two times a day (BID) | ORAL | 2 refills | Status: DC

## 2019-12-05 MED ORDER — DOXEPIN HCL 100 MG PO CAPS: 100.0000 mg | ORAL_CAPSULE | Freq: Every day | ORAL | 3 refills | Status: DC

## 2019-12-05 NOTE — Progress Notes (Signed)
Bethany Robinson is a 29 y.o. female  Chief Complaint  Patient presents with  . Hand Pain    all fingers seem to be burning, tingling, and numb-pt states they feel bruised on fingertips-cymbalta helped for awhile but hurt mentally    HPI: Bethany Robinson is a 29 y.o. female who complains of burning, tingling, numbness in B/L hands and fingers. She states they constantly feel "bruised". We had tried Cymbalta which helped symptoms but she was not able to tolerate d/t psych side effects. Pt states even touching keyboard keys hurts her fingers. Neck pain - no UE pain - no Swelling - no Redness - yes Joint pains - no Does not affect/impact sleep.   She sees neuro Dr. Tomi Likens for migraines.   Past Medical History:  Diagnosis Date  . Anxiety   . Common migraine with intractable migraine 09/27/2017  . Depression   . Family history of BRCA2 gene positive   . Family history of breast cancer     Past Surgical History:  Procedure Laterality Date  . TONSILLECTOMY      Social History   Socioeconomic History  . Marital status: Single    Spouse name: Not on file  . Number of children: 0  . Years of education: 71  . Highest education level: Not on file  Occupational History  . Occupation: cookout  Tobacco Use  . Smoking status: Never Smoker  . Smokeless tobacco: Never Used  Substance and Sexual Activity  . Alcohol use: Yes    Comment: socially  . Drug use: Yes    Comment: THC Gummies  . Sexual activity: Yes    Birth control/protection: Implant, None    Comment: 1st intercourse- 21, partners- greater than 10,   Other Topics Concern  . Not on file  Social History Narrative   Right handed   Apartment lives in   Lives alone   No kids   Caffeine 2 cups of coffee a day    Social Determinants of Health   Financial Resource Strain:   . Difficulty of Paying Living Expenses:   Food Insecurity:   . Worried About Charity fundraiser in the Last Year:   . Arboriculturist in the  Last Year:   Transportation Needs:   . Film/video editor (Medical):   Marland Kitchen Lack of Transportation (Non-Medical):   Physical Activity:   . Days of Exercise per Week:   . Minutes of Exercise per Session:   Stress:   . Feeling of Stress :   Social Connections:   . Frequency of Communication with Friends and Family:   . Frequency of Social Gatherings with Friends and Family:   . Attends Religious Services:   . Active Member of Clubs or Organizations:   . Attends Archivist Meetings:   Marland Kitchen Marital Status:   Intimate Partner Violence:   . Fear of Current or Ex-Partner:   . Emotionally Abused:   Marland Kitchen Physically Abused:   . Sexually Abused:     Family History  Problem Relation Age of Onset  . Migraines Father   . BRCA 1/2 Mother        BRCA2 pos  . BRCA 1/2 Maternal Aunt        BRCA2 pos  . Breast cancer Maternal Grandmother   . Depression Paternal Grandmother   . Heart failure Paternal Grandmother   . Crohn's disease Paternal Grandmother   . CVA Paternal Grandmother   . Other Paternal Grandmother  CREST   . Sjogren's syndrome Paternal Grandmother   . Breast cancer Other   . Sjogren's syndrome Paternal Great-grandmother   . Heart attack Maternal Great-grandmother 82  . Hypertension Maternal Great-grandmother   . Bladder Cancer Maternal Great-grandmother      Immunization History  Administered Date(s) Administered  . Influenza Inj Mdck Quad Pf 05/31/2017  . Influenza,inj,Quad PF,6+ Mos 10/04/2018    Outpatient Encounter Medications as of 12/05/2019  Medication Sig  . albuterol (VENTOLIN HFA) 108 (90 Base) MCG/ACT inhaler Inhale 2 puffs into the lungs every 6 (six) hours as needed for wheezing or shortness of breath.  . beclomethasone (QVAR) 40 MCG/ACT inhaler Inhale 1 puff into the lungs 2 (two) times daily.  . BuPROPion HBr (APLENZIN) 348 MG TB24 Take 1 tablet by mouth daily.  . Cetirizine HCl (ZYRTEC PO) Take 10 mg by mouth daily.   . cloNIDine (CATAPRES)  0.1 MG tablet Take 0.1 mg by mouth daily.  Marland Kitchen doxepin (SINEQUAN) 25 MG capsule Take 75 mg by mouth at bedtime.  Marland Kitchen etonogestrel (NEXPLANON) 68 MG IMPL implant 1 each by Subdermal route once.  . Galcanezumab-gnlm (EMGALITY) 120 MG/ML SOAJ Inject 120 mg into the skin every 28 (twenty-eight) days.  . rizatriptan (MAXALT) 10 MG tablet Take 10 mg by mouth as needed for migraine. May repeat in 2 hours if needed  . traZODone (DESYREL) 50 MG tablet Take 25 mg by mouth at bedtime.  . Vitamin D, Ergocalciferol, (DRISDOL) 1.25 MG (50000 UT) CAPS capsule TAKE 1 CAPSULE BY MOUTH EVERY 7 (SEVEN) DAYS. (Patient not taking: Reported on 12/05/2019)   Facility-Administered Encounter Medications as of 12/05/2019  Medication  . gadopentetate dimeglumine (MAGNEVIST) injection 18 mL     ROS: Pertinent positives and negatives noted in HPI. Remainder of ROS non-contributory    Allergies  Allergen Reactions  . Hydrocodone Swelling  . Topamax [Topiramate]     Cognitive slowing    BP 118/80 (BP Location: Right Arm, Patient Position: Sitting, Cuff Size: Normal)   Pulse 98   Temp 98.4 F (36.9 C) (Tympanic)   Ht 5' 6"  (1.676 m)   Wt 226 lb 6.4 oz (102.7 kg)   SpO2 99%   BMI 36.54 kg/m   Physical Exam  Constitutional: She is oriented to person, place, and time. She appears well-developed and well-nourished. No distress.  Musculoskeletal:        General: No tenderness, deformity or edema.  Neurological: She is alert and oriented to person, place, and time. She has normal strength. No sensory deficit.  Skin: Skin is warm and dry.  Psychiatric: She has a normal mood and affect. Her behavior is normal.     A/P:  1. Numbness of fingers of both hands - previously tried on gabapentin, cymbalta. Cymbalta was somewhat effective but pt was unable to tolerate d/t worsening depression Rx: - pregabalin (LYRICA) 75 MG capsule; Take 1 capsule (75 mg total) by mouth 2 (two) times daily.  Dispense: 60 capsule; Refill:  2 - Ambulatory referral to Neurology  This visit occurred during the SARS-CoV-2 public health emergency.  Safety protocols were in place, including screening questions prior to the visit, additional usage of staff PPE, and extensive cleaning of exam room while observing appropriate contact time as indicated for disinfecting solutions.

## 2019-12-12 ENCOUNTER — Encounter: Payer: Self-pay | Admitting: Family Medicine

## 2019-12-12 DIAGNOSIS — G8929 Other chronic pain: Secondary | ICD-10-CM

## 2019-12-12 DIAGNOSIS — R5382 Chronic fatigue, unspecified: Secondary | ICD-10-CM

## 2019-12-12 DIAGNOSIS — R002 Palpitations: Secondary | ICD-10-CM

## 2019-12-12 DIAGNOSIS — R519 Headache, unspecified: Secondary | ICD-10-CM

## 2019-12-12 DIAGNOSIS — R42 Dizziness and giddiness: Secondary | ICD-10-CM

## 2019-12-12 NOTE — Telephone Encounter (Signed)
Please see message.  Thank you. Did you want me to print these off and abstract?

## 2019-12-22 ENCOUNTER — Encounter: Payer: Self-pay | Admitting: Family Medicine

## 2019-12-22 NOTE — Telephone Encounter (Signed)
Please see message and advise.  Thank you. ° °

## 2020-01-02 ENCOUNTER — Ambulatory Visit: Payer: 59 | Admitting: Cardiology

## 2020-01-05 ENCOUNTER — Other Ambulatory Visit: Payer: Self-pay

## 2020-01-05 ENCOUNTER — Ambulatory Visit: Payer: 59 | Admitting: Cardiology

## 2020-01-05 ENCOUNTER — Encounter: Payer: Self-pay | Admitting: Cardiology

## 2020-01-05 VITALS — BP 122/86 | HR 85 | Temp 97.0°F | Ht 66.0 in | Wt 230.0 lb

## 2020-01-05 DIAGNOSIS — Z7182 Exercise counseling: Secondary | ICD-10-CM

## 2020-01-05 DIAGNOSIS — R42 Dizziness and giddiness: Secondary | ICD-10-CM | POA: Diagnosis not present

## 2020-01-05 DIAGNOSIS — Z7189 Other specified counseling: Secondary | ICD-10-CM | POA: Diagnosis not present

## 2020-01-05 DIAGNOSIS — R002 Palpitations: Secondary | ICD-10-CM

## 2020-01-05 NOTE — Progress Notes (Signed)
Cardiology Office Note:    Date:  01/05/2020   ID:  Bethany Robinson, DOB 03-May-1991, MRN [id number]  PCP:  Ronnald Nian, DO  Cardiologist:  Buford Dresser, MD  Referring MD: Ronnald Nian, DO   CC: new patient consultation for palpitations  History of Present Illness:    Bethany Robinson is a 29 y.o. female without prior cardiac history who is seen as a new consult at the request of Ronnald Nian, DO for the evaluation and management of palpitations.  Note reviewed from Dr. Bryan Lemma on 12/05/19. That visit was directed at bilateral hand finger/numbness. However, messages after that visit note concern for POTS.  Patient concerns today: Not sure what is going on. Pain is her worst symptom, especially in her hands and fingers. She hurts throughout her body.  During grad school, she started having dizzy spells. Went to a neurologist to rule out vertigo. Has had "head rushes" for a long time but got worse around the same time. Still has dizzy spells, especially with changing position. Also feels like her heart is pounding hard.   She can be in bed or resting when her heart is pounding. Happens about once/month. Lasts around 10 minutes. No clear aggravating/alleviating factors.  Dizziness is moving from sitting to standing. Happens most days to some degree, nearly every time she stands. Does have occasional tunnel vision. No syncope.   -Prior cardiac history: none -Prior ECG:  -Prior workup: none -Prior treatment: none -Possible medication interactions: -Caffeine: 1-2 cups of coffee/day, maybe 1 soda/day -Alcohol: occasional 1-2 drinks -Tobacco: never -OTC supplements/medications: clonidine at bedtime to help her relax. Has been on bupropion for about 2-3 years. Symptoms predate bupropion -Comorbidities: severe depression -Exercise level: largely sedentary -Labs: recent inflammatory panel reviewed, unremarkable -Cardiac ROS: no chest pain, no shortness of  breath, no PND, no orthopnea, no LE edema. -Family history: maternal grandfather with unknown heart issue, father's side with unknown heart issues. Mother and father without heart issues that she knows of.  POTS was mentioned by her counselor, and her mother also works with someone with POTS. She is hoping this may explain her symptoms.  Past Medical History:  Diagnosis Date  . Anxiety   . Common migraine with intractable migraine 09/27/2017  . Depression   . Family history of BRCA2 gene positive   . Family history of breast cancer     Past Surgical History:  Procedure Laterality Date  . TONSILLECTOMY      Current Medications: Current Outpatient Medications on File Prior to Visit  Medication Sig  . albuterol (VENTOLIN HFA) 108 (90 Base) MCG/ACT inhaler Inhale 2 puffs into the lungs every 6 (six) hours as needed for wheezing or shortness of breath.  . beclomethasone (QVAR) 40 MCG/ACT inhaler Inhale 1 puff into the lungs 2 (two) times daily.  . BuPROPion HBr (APLENZIN) 348 MG TB24 Take 1 tablet by mouth daily.  . Cetirizine HCl (ZYRTEC PO) Take 10 mg by mouth daily.   . cloNIDine (CATAPRES) 0.1 MG tablet Take 0.1 mg by mouth daily.  Marland Kitchen doxepin (SINEQUAN) 100 MG capsule Take 1 capsule (100 mg total) by mouth at bedtime.  Marland Kitchen etonogestrel (NEXPLANON) 68 MG IMPL implant 1 each by Subdermal route once.  . Galcanezumab-gnlm (EMGALITY) 120 MG/ML SOAJ Inject 120 mg into the skin every 28 (twenty-eight) days.  . pregabalin (LYRICA) 75 MG capsule Take 1 capsule (75 mg total) by mouth 2 (two) times daily.  . rizatriptan (MAXALT) 10 MG tablet Take 10  mg by mouth as needed for migraine. May repeat in 2 hours if needed   Current Facility-Administered Medications on File Prior to Visit  Medication  . gadopentetate dimeglumine (MAGNEVIST) injection 18 mL     Allergies:   Hydrocodone and Topamax [topiramate]   Social History   Tobacco Use  . Smoking status: Never Smoker  . Smokeless tobacco:  Never Used  Substance Use Topics  . Alcohol use: Yes    Comment: socially  . Drug use: Yes    Comment: THC Gummies    Family History: family history includes BRCA 1/2 in her maternal aunt and mother; Bladder Cancer in her maternal great-grandmother; Breast cancer in her maternal grandmother and another family member; CVA in her paternal grandmother; Crohn's disease in her paternal grandmother; Depression in her paternal grandmother; Heart attack (age of onset: 68) in her maternal great-grandmother; Heart failure in her paternal grandmother; Hypertension in her maternal great-grandmother; Migraines in her father; Other in her paternal grandmother; Sjogren's syndrome in her paternal grandmother and paternal great-grandmother.  ROS:   Please see the history of present illness.  Additional pertinent ROS: Constitutional: Negative for chills, fever, night sweats, unintentional weight loss  HENT: Negative for ear pain and hearing loss.   Eyes: Negative for loss of vision and eye pain.  Respiratory: Negative for cough, sputum, wheezing.   Cardiovascular: See HPI. Gastrointestinal: Negative for abdominal pain, melena, and hematochezia.  Genitourinary: Negative for dysuria and hematuria.  Musculoskeletal: Negative for falls and myalgias.  Skin: Negative for itching and rash.  Neurological: Negative for focal weakness, focal sensory changes and loss of consciousness.  Endo/Heme/Allergies: Does not bruise/bleed easily.     EKGs/Labs/Other Studies Reviewed:    The following studies were reviewed today: No prior cardiac studies  EKG:  EKG is personally reviewed.  The ekg ordered today demonstrates NSR at 64 bpm  Recent Labs: 04/13/2019: ALT 16; BUN 17; Creatinine, Ser 0.86; Hemoglobin 14.4; Magnesium 2.1; Platelets 227; Potassium 4.0; Sodium 139 04/18/2019: TSH 2.380  Recent Lipid Panel    Component Value Date/Time   CHOL 171 10/04/2018 0904   TRIG 67.0 10/04/2018 0904   HDL 41.20 10/04/2018  0904   CHOLHDL 4 10/04/2018 0904   VLDL 13.4 10/04/2018 0904   LDLCALC 116 (H) 10/04/2018 0904    Physical Exam:    VS:  BP 122/86   Pulse 85   Temp (!) 97 F (36.1 C)   Ht 5' 6"  (1.676 m)   Wt 230 lb (104.3 kg)   SpO2 98%   BMI 37.12 kg/m    Orthostatics: Lying 111/71, HR 69 Sitting 110/79, HR 79 Standing 122/86, HR 85  Wt Readings from Last 3 Encounters:  01/05/20 230 lb (104.3 kg)  12/05/19 226 lb 6.4 oz (102.7 kg)  09/29/19 215 lb (97.5 kg)    GEN: Well nourished, well developed in no acute distress HEENT: Normal, moist mucous membranes NECK: No JVD CARDIAC: regular rhythm, normal S1 and S2, no rubs or gallops. No murmurs. VASCULAR: Radial and DP pulses 2+ bilaterally. No carotid bruits RESPIRATORY:  Clear to auscultation without rales, wheezing or rhonchi  ABDOMEN: Soft, non-tender, non-distended MUSCULOSKELETAL:  Ambulates independently SKIN: Warm and dry, no edema NEUROLOGIC:  Alert and oriented x 3. No focal neuro deficits noted. PSYCHIATRIC:  Normal affect    ASSESSMENT:    No diagnosis found. PLAN:    Dizzyness, rare palpitations: does not meet criteria for POTS. This was understandably distressing, as she had hoped that this would  explain her symptoms -we discussed dysautonomia in general and POTS criteria specifically. She does not meet criteria for POTS diagnosis. We did review general recommendations, and the following recommendations were emphasized: -avoid dehydration. Often it requires high volumes of fluids, often with salt/electrolytes included, to stay hydrated. Some people are very sensitive to fluid shifts and dehydration. Oral rehydration is preferred, and routine use of IV fluids is not recommended. -if tolerated, compression stocking can assist with fluid management and prevent pooling in the legs. -slow position changes are recommended -if there is a feeling of severe lightheadedness, like near to passing out, recommend lying on the floor on  the back, with legs elevated up on a chair or up against the wall. -the best long term management of symptoms is gradual exercise conditioning. I recommend seated exercises such as bike to start, to avoid the risk of falling with lightheadedness. Exercise programs, either through supervised programs like cardiac rehab or through personal programs, should focus on gradually increasing exercise tolerance and conditioning.  -Though she does not meet formal criteria, we discussed the typical spectrum of dysautonomia, including typical populations, that this sometimes spontaneously improves with age (though a small percentage have persistent symptoms), that this has uncomfortable symptoms but is not associated with long term mortality, and that the etiology/treatment of this is an area of active research -we also discussed her palpitations. Discussed monitor vs. [facility]. Given the infrequent events (about once/month) and brief duration, will continue to watch for now. She will contact us if this becomes more frequent. -ECG normal sinus rhythm  Cardiac risk counseling and prevention recommendations: -recommend heart healthy/Mediterranean diet, with whole grains, fruits, vegetable, fish, lean meats, nuts, and olive oil. Limit salt. -recommend moderate walking, 3-5 times/week for 30-50 minutes each session. Aim for at least 150 minutes.week. Goal should be pace of 3 miles/hours, or walking 1.5 miles in 30 minutes -recommend avoidance of tobacco products. Avoid excess alcohol. -ASCVD risk score: The ASCVD Risk score Mikey Bussing DC Jr., et al., 2013) failed to calculate for the following reasons:   The 2013 ASCVD risk score is only valid for ages 30 to 75    Plan for follow up: as needed  Buford Dresser, MD, PhD   [facility]    Medication Adjustments/Labs and Tests Ordered: Current medicines are reviewed at length with the patient today.  Concerns regarding medicines are outlined  above.  Orders Placed This Encounter  Procedures  . EKG 12-Lead   No orders of the defined types were placed in this encounter.   Patient Instructions  Medication Instructions:  Your Physician recommend you continue on your current medication as directed.    *If you need a refill on your cardiac medications before your next appointment, please call your pharmacy*   Lab Work: None   Testing/Procedures: None   Follow-Up: At [facility], you and your health needs are our priority.  As part of our continuing mission to provide you with exceptional heart care, we have created designated Provider Care Teams.  These Care Teams include your primary Cardiologist (physician) and Advanced Practice Providers (APPs -  Physician Assistants and Nurse Practitioners) who all work together to provide you with the care you need, when you need it.  We recommend signing up for the patient portal called "MyChart".  Sign up information is provided on this After Visit Summary.  MyChart is used to connect with patients for Virtual Visits (Telemedicine).  Patients are able to view lab/test results, encounter notes,  upcoming appointments, etc.  Non-urgent messages can be sent to your provider as well.   To learn more about what you can do with MyChart, go to [email or web address].    Your next appointment:   As needed  The format for your next appointment:   Either In Person or Virtual  Provider:   Buford Dresser, MD  Dr. Harrell Gave recommends you do the following:  -avoid dehydration. Often it requires high volumes of fluids, often with salt/electrolytes included, to stay hydrated. People can be very sensitive to fluid shifts and dehydration. Oral rehydration is preferred, and routine use of IV fluids is not recommended. -if tolerated, compression stocking can assist with fluid management and prevent pooling in the legs. -slow position changes are recommended -if there is a feeling of  severe lightheadedness, like near to passing out, recommend lying on the floor on the back, with legs elevated up on a chair or up against the wall. -the best long term management is gradual exercise conditioning. I recommend seated exercises such as bike to start, to avoid the risk of falling with lightheadedness. Exercise programs, either through supervised programs like cardiac rehab or through personal programs, should focus on gradually increasing exercise tolerance and conditioning.     Signed, Buford Dresser, MD PhD 01/05/2020    [place]

## 2020-01-05 NOTE — Patient Instructions (Addendum)
Medication Instructions:  Your Physician recommend you continue on your current medication as directed.    *If you need a refill on your cardiac medications before your next appointment, please call your pharmacy*   Lab Work: None   Testing/Procedures: None   Follow-Up: At [facility], you and your health needs are our priority.  As part of our continuing mission to provide you with exceptional heart care, we have created designated Provider Care Teams.  These Care Teams include your primary Cardiologist (physician) and Advanced Practice Providers (APPs -  Physician Assistants and Nurse Practitioners) who all work together to provide you with the care you need, when you need it.  We recommend signing up for the patient portal called "MyChart".  Sign up information is provided on this After Visit Summary.  MyChart is used to connect with patients for Virtual Visits (Telemedicine).  Patients are able to view lab/test results, encounter notes, upcoming appointments, etc.  Non-urgent messages can be sent to your provider as well.   To learn more about what you can do with MyChart, go to [email or web address].    Your next appointment:   As needed  The format for your next appointment:   Either In Person or Virtual  Provider:   Jodelle Red, MD  Dr. Cristal Deer recommends you do the following:  -avoid dehydration. Often it requires high volumes of fluids, often with salt/electrolytes included, to stay hydrated. People can be very sensitive to fluid shifts and dehydration. Oral rehydration is preferred, and routine use of IV fluids is not recommended. -if tolerated, compression stocking can assist with fluid management and prevent pooling in the legs. -slow position changes are recommended -if there is a feeling of severe lightheadedness, like near to passing out, recommend lying on the floor on the back, with legs elevated up on a chair or up against the wall. -the best  long term management is gradual exercise conditioning. I recommend seated exercises such as bike to start, to avoid the risk of falling with lightheadedness. Exercise programs, either through supervised programs like cardiac rehab or through personal programs, should focus on gradually increasing exercise tolerance and conditioning.

## 2020-02-18 ENCOUNTER — Encounter: Payer: Self-pay | Admitting: Cardiology

## 2020-02-26 NOTE — Progress Notes (Signed)
NEUROLOGY FOLLOW UP OFFICE NOTE  Bethany Robinson [id number]  HISTORY OF PRESENT ILLNESS: Bethany Robinson is a 29 year old right-handed female with anxiety who follows up for migraine with aura as well as new issue, pain in her fingertips  UPDATE: Around the beginning of the year, she started experiencing pain in all of her fingertips, described as a "deep bruising" pain, not a burning, stinging, or electric pain.  It often is aggravated when applying any degree of pressure on her fingertips.  No associated numbness or tingling.  She reports difficulty holding objects in her hands.  Sometimes her fingers turn white.  She has tension in her shoulders but no neck pain..  Symptoms somewhat improved after getting a neck adjustment by a chiropractor but have not resolved.  She notes some mild tingling along the outside of her ankles and feet.  Rheumatologic workup from January, including ANA, ds-DNA, Scl-70, sed rate 5, and CRP 3, were unremarkable.  Labs from August 2020 included TSH 2.380.  Labs from March included B12 576, vitamin D 19.8 (started supplement).  She did not tolerate Cymbalta.  She was unable to get Emgality.  Apparently, it was not approved by her insurance and the pharmacist said she cannot use the copay card if the insurance did not approve the medication.  Headaches did improve but have increased recently.   Intensity:  6/10 Duration:  2 hours Frequency:  6 to 10 Rescue therapy:  Ibuprofen, rarely rizatriptan Current NSAIDS:Aleve, ibuprofen Current analgesics:Excedrin Current triptans:Rizatriptan 51m Current ergotamine:none Current anti-emetic:none Current muscle relaxants:none Current anti-anxiolytic:none Current sleep aide:trazodone Current Antihypertensive medications:clonidine Current Antidepressant medications:bupropion 3013m doxepin; trazodone Current Anticonvulsant medications:none Current anti-CGRP:Emgality Current  Vitamins/Herbal/Supplements:D, B12 (sometimes) Current Antihistamines/Decongestants:Zyrtec Other therapy:none Hormone/birth control:Nexplanon  Caffeine:1-2 cups of coffee or 1 soda 3 to 4 days a week Alcohol:Socially (2 to 3 drinks a week) Smoker:no Diet:Needs to increase water intake Exercise:no Depression:yes; Anxiety:yes Other pain:general Sleep hygiene:suboptimal  HISTORY: She has history of various neurologic symptomssince 2018, such asdizziness,numbness and tinglingof hands feet, and legs, word-finding difficulty/stutteringand fatigue. She has tremors in hand and has trouble with dexterity and fine-finger movements. Symptoms are intermittent. She had workup performed by a previous neurologist, which was negative. MRI of brain with and without contrast from 10/02/17 was normal. Symptoms subsequently resolved. On 04/13/19, she presented to the ED for full-bodytwitching and muscle spasms that started after ingesting marijuana-laced gummy bear, which she has eaten in the past without complication. She was treated with IVF and Ativan. Following this event, she started to experience her previous symptoms, such as fatigue, paresthesias and word-finding difficulty.B12 316; TSH 2.380. Headaches have gotten worse as well.  She has several headaches: She reports ice-pick headaches: Usually right-temple but may vary in location. They are severe stabbing pain. However she has a constant non-throbbing pain in various locations (right sided, behind head or behind eye).  Her typical migraines, since middle school, are a severe right occipital throbbing headache associated photophobia, phonophobia, sometimes nausea but no vomiting, visual disturbance or unilateral numbness and weakness. They typically last 2 hours and occur maybe 2 to 3 times month. No specific triggers. They are relieved with analgesics and sleep.  Past NSAIDS:Naproxen 50085mast  analgesics:Tylenol Past abortive triptans:Sumatriptan 41m48mst abortive ergotamine:none Past muscle relaxants:none Past anti-emetic:none Past antihypertensive medications:none Past antidepressant medications:venlafaxine XR 75mg85mmbalta 60mg 40m anticonvulsant medications:topiramate 75mg (72mwhat helpful butcognitive deficits), gabapentin100mg in81mand 200mg in 1mast anti-CGRP:none Past vitamins/Herbal/Supplements:none Past antihistamines/decongestants:none Other past therapies:none  Family history of headache:Father (migraine with aura such as right sided body numbness) Other family history:  Paternal grandmother (CREST syndrome)  PAST MEDICAL HISTORY: Past Medical History:  Diagnosis Date  . Anxiety   . Common migraine with intractable migraine 09/27/2017  . Depression   . Family history of BRCA2 gene positive   . Family history of breast cancer     MEDICATIONS: Current Outpatient Medications on File Prior to Visit  Medication Sig Dispense Refill  . albuterol (VENTOLIN HFA) 108 (90 Base) MCG/ACT inhaler Inhale 2 puffs into the lungs every 6 (six) hours as needed for wheezing or shortness of breath. 8 g 2  . beclomethasone (QVAR) 40 MCG/ACT inhaler Inhale 1 puff into the lungs 2 (two) times daily. 10.6 g 2  . BuPROPion HBr (APLENZIN) 348 MG TB24 Take 1 tablet by mouth daily.    . Cetirizine HCl (ZYRTEC PO) Take 10 mg by mouth daily.     . cloNIDine (CATAPRES) 0.1 MG tablet Take 0.1 mg by mouth daily.    Marland Kitchen doxepin (SINEQUAN) 100 MG capsule Take 1 capsule (100 mg total) by mouth at bedtime. 90 capsule 3  . etonogestrel (NEXPLANON) 68 MG IMPL implant 1 each by Subdermal route once.    . Galcanezumab-gnlm (EMGALITY) 120 MG/ML SOAJ Inject 120 mg into the skin every 28 (twenty-eight) days. 1 pen 11  . pregabalin (LYRICA) 75 MG capsule Take 1 capsule (75 mg total) by mouth 2 (two) times daily. 60 capsule 2  . rizatriptan (MAXALT) 10 MG tablet Take  10 mg by mouth as needed for migraine. May repeat in 2 hours if needed     Current Facility-Administered Medications on File Prior to Visit  Medication Dose Route Frequency Provider Last Rate Last Admin  . gadopentetate dimeglumine (MAGNEVIST) injection 18 mL  18 mL Intravenous Once PRN Kathrynn Ducking, MD        ALLERGIES: Allergies  Allergen Reactions  . Hydrocodone Swelling  . Topamax [Topiramate]     Cognitive slowing    FAMILY HISTORY: Family History  Problem Relation Age of Onset  . Migraines Father   . BRCA 1/2 Mother        BRCA2 pos  . BRCA 1/2 Maternal Aunt        BRCA2 pos  . Breast cancer Maternal Grandmother   . Depression Paternal Grandmother   . Heart failure Paternal Grandmother   . Crohn's disease Paternal Grandmother   . CVA Paternal Grandmother   . Other Paternal Grandmother        CREST   . Sjogren's syndrome Paternal Grandmother   . Breast cancer Other   . Sjogren's syndrome Paternal Great-grandmother   . Heart attack Maternal Great-grandmother 70  . Hypertension Maternal Great-grandmother   . Bladder Cancer Maternal Great-grandmother    SOCIAL HISTORY: Social History   Socioeconomic History  . Marital status: Single    Spouse name: Not on file  . Number of children: 0  . Years of education: 33  . Highest education level: Not on file  Occupational History  . Occupation: cookout  Tobacco Use  . Smoking status: Never Smoker  . Smokeless tobacco: Never Used  Vaping Use  . Vaping Use: Never used  Substance and Sexual Activity  . Alcohol use: Yes    Comment: socially  . Drug use: Yes    Comment: THC Gummies  . Sexual activity: Yes    Birth control/protection: Implant, None    Comment: 1st intercourse- 21, partners-  greater than 10,   Other Topics Concern  . Not on file  Social History Narrative   Right handed   Apartment lives in   Lives alone   No kids   Caffeine 2 cups of coffee a day    Social Determinants of Health    Financial Resource Strain:   . Difficulty of Paying Living Expenses:   Food Insecurity:   . Worried About Charity fundraiser in the Last Year:   . Arboriculturist in the Last Year:   Transportation Needs:   . Film/video editor (Medical):   Marland Kitchen Lack of Transportation (Non-Medical):   Physical Activity:   . Days of Exercise per Week:   . Minutes of Exercise per Session:   Stress:   . Feeling of Stress :   Social Connections:   . Frequency of Communication with Friends and Family:   . Frequency of Social Gatherings with Friends and Family:   . Attends Religious Services:   . Active Member of Clubs or Organizations:   . Attends Archivist Meetings:   Marland Kitchen Marital Status:   Intimate Partner Violence:   . Fear of Current or Ex-Partner:   . Emotionally Abused:   Marland Kitchen Physically Abused:   . Sexually Abused:     PHYSICAL EXAM: Blood pressure 122/79, pulse (!) 106, height _0  (1.676 m), weight 224 lb 3.2 oz (101.7 kg), SpO2 97 %. General: No acute distress.  Patient appears well-groomed.   Head:  Normocephalic/atraumatic Eyes:  Fundi examined but not visualized Neck: supple, no paraspinal tenderness, full range of motion Heart:  Regular rate and rhythm Lungs:  Clear to auscultation bilaterally Back: No paraspinal tenderness Neurological Exam: alert and oriented to person, place, and time. Attention span and concentration intact, recent and remote memory intact, fund of knowledge intact.  Speech fluent and not dysarthric, language intact.  CN II-XII intact. Bulk and tone normal, muscle strength 5/5 throughout.  Sensation to pinprick and vibration intact.  Deep tendon reflexes 2+ throughout, toes downgoing.  Finger to nose and heel to shin testing intact.  Gait normal, Romberg negative.  IMPRESSION: 1.  Migraine without aura, without status migrainosus, not intractable 2.  Migraine with aura, without status migrainosus, not intractable 3.  Primary stabbing headache 4.   Pain in her fingertips.  Also describes symptoms suspicious for Raynaud's.  Rheumatologic workup in January was negative.  I have no explanation.  She does have history of various somatic complaints of unknown cause.  PLAN: 1.  For preventative management, restart Emgality.  She has failed at least two common preventative medications (topiramate, venlafaxine), so it should be approved.  Regardless, she should be able to get the medication with the co-pay card.  If she has any trouble, she was advised to contact us. 2.  For abortive therapy, ibuprofen or rizatriptan 44m 3.  Limit use of pain relievers to no more than 2 days out of week to prevent risk of rebound or medication-overuse headache. 4.  Keep headache diary 5.  Exercise, hydration, caffeine cessation, sleep hygiene, monitor for and avoid triggers 6.  Follow up 6 months.   AMetta Clines DO  CC: MLetta Median DO

## 2020-02-27 ENCOUNTER — Other Ambulatory Visit: Payer: Self-pay

## 2020-02-27 ENCOUNTER — Ambulatory Visit: Payer: 59 | Admitting: Neurology

## 2020-02-27 ENCOUNTER — Encounter: Payer: Self-pay | Admitting: Neurology

## 2020-02-27 VITALS — BP 122/79 | HR 106 | Ht 66.0 in | Wt 224.2 lb

## 2020-02-27 DIAGNOSIS — G43109 Migraine with aura, not intractable, without status migrainosus: Secondary | ICD-10-CM | POA: Diagnosis not present

## 2020-02-27 DIAGNOSIS — R52 Pain, unspecified: Secondary | ICD-10-CM

## 2020-02-27 DIAGNOSIS — G4485 Primary stabbing headache: Secondary | ICD-10-CM | POA: Diagnosis not present

## 2020-02-27 MED ORDER — EMGALITY 120 MG/ML ~~LOC~~ SOAJ: 120.0000 mg | SUBCUTANEOUS | 11 refills | Status: DC

## 2020-02-27 NOTE — Patient Instructions (Signed)
1.  Restart Emgality.  If you have a problem at the pharmacy or using the copay card, contact us. 2.  Use rizatriptan as needed.  Limit use of pain relievers to no more than 2 days out of week to prevent risk of rebound or medication-overuse headache. 3.  Follow up in 6 months.

## 2020-03-15 ENCOUNTER — Encounter: Payer: Self-pay | Admitting: Neurology

## 2020-03-15 NOTE — Progress Notes (Addendum)
Quenton Fetter Key: [id number] - [place] Case ID: [id number] Need help? Call us at [phone] Outcome Approvedon July 19 Request Reference Number: [id number]. EMGALITY INJ 120MG /ML is approved through 09/15/2020. Your patient may now fill this prescription and it will be covered. Drug Emgality 120MG /ML auto-injectors (migraine) Form [facility] Electronic Prior Authorization Form (2017 NCPDP)

## 2020-03-17 ENCOUNTER — Encounter: Payer: Self-pay | Admitting: Family Medicine

## 2020-03-17 ENCOUNTER — Other Ambulatory Visit: Payer: Self-pay | Admitting: Family Medicine

## 2020-03-17 ENCOUNTER — Ambulatory Visit (INDEPENDENT_AMBULATORY_CARE_PROVIDER_SITE_OTHER): Payer: 59 | Admitting: Family Medicine

## 2020-03-17 ENCOUNTER — Other Ambulatory Visit: Payer: Self-pay

## 2020-03-17 VITALS — BP 110/86 | HR 81 | Temp 98.0°F | Ht 66.5 in | Wt 226.0 lb

## 2020-03-17 DIAGNOSIS — Z0000 Encounter for general adult medical examination without abnormal findings: Secondary | ICD-10-CM | POA: Diagnosis not present

## 2020-03-17 DIAGNOSIS — J4530 Mild persistent asthma, uncomplicated: Secondary | ICD-10-CM | POA: Diagnosis not present

## 2020-03-17 DIAGNOSIS — L700 Acne vulgaris: Secondary | ICD-10-CM

## 2020-03-17 DIAGNOSIS — G43019 Migraine without aura, intractable, without status migrainosus: Secondary | ICD-10-CM | POA: Diagnosis not present

## 2020-03-17 DIAGNOSIS — E559 Vitamin D deficiency, unspecified: Secondary | ICD-10-CM | POA: Diagnosis not present

## 2020-03-17 LAB — CBC
HCT: 42.1 % (ref 36.0–46.0)
Hemoglobin: 13.9 g/dL (ref 12.0–15.0)
MCHC: 33 g/dL (ref 30.0–36.0)
MCV: 88.1 fl (ref 78.0–100.0)
Platelets: 223 10*3/uL (ref 150.0–400.0)
RBC: 4.78 Mil/uL (ref 3.87–5.11)
RDW: 13.1 % (ref 11.5–15.5)
WBC: 6 10*3/uL (ref 4.0–10.5)

## 2020-03-17 LAB — BASIC METABOLIC PANEL
BUN: 13 mg/dL (ref 6–23)
CO2: 27 mEq/L (ref 19–32)
Calcium: 9.2 mg/dL (ref 8.4–10.5)
Chloride: 106 mEq/L (ref 96–112)
Creatinine, Ser: 0.94 mg/dL (ref 0.40–1.20)
GFR: 71.53 mL/min (ref >60.00)
Glucose, Bld: 88 mg/dL (ref 70–99)
Potassium: 4.5 mEq/L (ref 3.5–5.1)
Sodium: 140 mEq/L (ref 135–145)

## 2020-03-17 LAB — LIPID PANEL
Cholesterol: 128 mg/dL (ref 0–200)
HDL: 36.9 mg/dL — ABNORMAL LOW (ref >39.00)
LDL Cholesterol: 77 mg/dL (ref 0–99)
NonHDL: 91.28
Total CHOL/HDL Ratio: 3
Triglycerides: 69 mg/dL (ref 0.0–149.0)
VLDL: 13.8 mg/dL (ref 0.0–40.0)

## 2020-03-17 LAB — AST: AST: 13 U/L (ref 0–37)

## 2020-03-17 LAB — ALT: ALT: 11 U/L (ref 0–35)

## 2020-03-17 LAB — VITAMIN D 25 HYDROXY (VIT D DEFICIENCY, FRACTURES): VITD: 14.89 ng/mL — ABNORMAL LOW (ref 30.00–100.00)

## 2020-03-17 MED ORDER — BENZOYL PEROXIDE WASH 10 % EX LIQD: CUTANEOUS | 3 refills | Status: DC

## 2020-03-17 MED ORDER — VITAMIN D (ERGOCALCIFEROL) 1.25 MG (50000 UNIT) PO CAPS: 50000.0000 [IU] | ORAL_CAPSULE | ORAL | 2 refills | Status: DC

## 2020-03-17 MED ORDER — ADAPALENE 0.1 % EX GEL: Freq: Every day | CUTANEOUS | 2 refills | Status: DC

## 2020-03-17 NOTE — Patient Instructions (Signed)
Health Maintenance Due  Topic Date Due  . Hepatitis C Screening  Never done  . COVID-19 Vaccine (1) Never done  . TETANUS/TDAP Pt reported performed in Oct 2020/[facility] Never done    Depression screen PHQ 2/9 10/04/2018  Decreased Interest 0  Down, Depressed, Hopeless 0  PHQ - 2 Score 0

## 2020-03-17 NOTE — Progress Notes (Signed)
Bethany Robinson is a 29 y.o. female  Chief Complaint  Patient presents with  . Annual Exam    Pt is fasting for lab work today.  Pt c/o acne on face, shoulders and back x 2 months    HPI: Bethany Robinson is a 29 y.o. female here for annual CPE, fasting labs.  She complains of facial acne as well as acne on shoulders and back  2 mo. She was on oral tetracycline years ago.   Last PAP: 09/2018  Diet/Exercise: average diet and working to make small changes; no regular exercise Dental: has appt today Vision: wears glasses; UTD on exam  Med refills needed today? no  Past Medical History:  Diagnosis Date  . Anxiety   . Common migraine with intractable migraine 09/27/2017  . Depression   . Family history of BRCA2 gene positive   . Family history of breast cancer     Past Surgical History:  Procedure Laterality Date  . TONSILLECTOMY      Social History   Socioeconomic History  . Marital status: Single    Spouse name: Not on file  . Number of children: 0  . Years of education: 52  . Highest education level: Not on file  Occupational History  . Occupation: cookout  Tobacco Use  . Smoking status: Never Smoker  . Smokeless tobacco: Never Used  Vaping Use  . Vaping Use: Never used  Substance and Sexual Activity  . Alcohol use: Yes    Comment: socially  . Drug use: Yes    Comment: THC Gummies  . Sexual activity: Yes    Birth control/protection: Implant, None    Comment: 1st intercourse- 21, partners- greater than 10,   Other Topics Concern  . Not on file  Social History Narrative   Right handed   Apartment lives in   Lives alone   No kids   Caffeine 2 cups of coffee a day    Social Determinants of Health   Financial Resource Strain:   . Difficulty of Paying Living Expenses:   Food Insecurity:   . Worried About Charity fundraiser in the Last Year:   . Arboriculturist in the Last Year:   Transportation Needs:   . Film/video editor (Medical):   Marland Kitchen Lack  of Transportation (Non-Medical):   Physical Activity:   . Days of Exercise per Week:   . Minutes of Exercise per Session:   Stress:   . Feeling of Stress :   Social Connections:   . Frequency of Communication with Friends and Family:   . Frequency of Social Gatherings with Friends and Family:   . Attends Religious Services:   . Active Member of Clubs or Organizations:   . Attends Archivist Meetings:   Marland Kitchen Marital Status:   Intimate Partner Violence:   . Fear of Current or Ex-Partner:   . Emotionally Abused:   Marland Kitchen Physically Abused:   . Sexually Abused:     Family History  Problem Relation Age of Onset  . Migraines Father   . BRCA 1/2 Mother        BRCA2 pos  . BRCA 1/2 Maternal Aunt        BRCA2 pos  . Breast cancer Maternal Grandmother   . Depression Paternal Grandmother   . Heart failure Paternal Grandmother   . Crohn's disease Paternal Grandmother   . CVA Paternal Grandmother   . Other Paternal Grandmother  CREST   . Sjogren's syndrome Paternal Grandmother   . Breast cancer Other   . Sjogren's syndrome Paternal Great-grandmother   . Heart attack Maternal Great-grandmother 16  . Hypertension Maternal Great-grandmother   . Bladder Cancer Maternal Great-grandmother      Immunization History  Administered Date(s) Administered  . Influenza Inj Mdck Quad Pf 05/31/2017  . Influenza,inj,Quad PF,6+ Mos 10/04/2018    Outpatient Encounter Medications as of 03/17/2020  Medication Sig  . albuterol (VENTOLIN HFA) 108 (90 Base) MCG/ACT inhaler Inhale 2 puffs into the lungs every 6 (six) hours as needed for wheezing or shortness of breath.  . beclomethasone (QVAR) 40 MCG/ACT inhaler Inhale 1 puff into the lungs 2 (two) times daily.  . BuPROPion HBr (APLENZIN) 348 MG TB24 Take 1 tablet by mouth daily.  . Cetirizine HCl (ZYRTEC PO) Take 10 mg by mouth daily.   . cloNIDine (CATAPRES) 0.1 MG tablet Take 0.1 mg by mouth daily.  Marland Kitchen doxepin (SINEQUAN) 100 MG capsule Take  1 capsule (100 mg total) by mouth at bedtime.  Marland Kitchen etonogestrel (NEXPLANON) 68 MG IMPL implant 1 each by Subdermal route once.  . Galcanezumab-gnlm (EMGALITY) 120 MG/ML SOAJ Inject 120 mg into the skin every 28 (twenty-eight) days.  . rizatriptan (MAXALT) 10 MG tablet Take 10 mg by mouth as needed for migraine. May repeat in 2 hours if needed  . adapalene (DIFFERIN) 0.1 % gel Apply topically at bedtime.  . Benzoyl Peroxide (BENZOYL PEROXIDE) 10 % LIQD Use daily in shower on affected areas  . pregabalin (LYRICA) 75 MG capsule Take 1 capsule (75 mg total) by mouth 2 (two) times daily. (Patient not taking: Reported on 02/27/2020)   Facility-Administered Encounter Medications as of 03/17/2020  Medication  . gadopentetate dimeglumine (MAGNEVIST) injection 18 mL     ROS: Gen: no fever, chills  Skin: no rash, itching; + acne - see HPI ENT: no ear pain, ear drainage, nasal congestion, rhinorrhea, sinus pressure, sore throat Eyes: no blurry vision, double vision Resp: no cough, wheeze,SOB Breast: no breast tenderness, no nipple discharge, no breast masses CV: no CP, palpitations, LE edema,  GI: no heartburn, n/v/d/c, abd pain GU: no dysuria, urgency, frequency, hematuria; no vaginal itching, odor, discharge MSK: no joint pain, myalgias, back pain Neuro: + headaches, lightheadedness, tingling in finers Psych: + anxiety, depression, sleep issues   Allergies  Allergen Reactions  . Hydrocodone Swelling  . Topamax [Topiramate]     Cognitive slowing    BP 110/86 (BP Location: Left Arm, Patient Position: Sitting, Cuff Size: Normal)   Pulse 81   Temp 98 F (36.7 C) (Temporal)   Ht 5' 6.5" (1.689 m)   Wt 226 lb (102.5 kg)   SpO2 99%   BMI 35.93 kg/m   Physical Exam Constitutional:      General: She is not in acute distress.    Appearance: She is well-developed.  HENT:     Head: Normocephalic and atraumatic.     Right Ear: Tympanic membrane and ear canal normal.     Left Ear: Tympanic  membrane and ear canal normal.     Nose: Nose normal.  Eyes:     Conjunctiva/sclera: Conjunctivae normal.     Pupils: Pupils are equal, round, and reactive to light.  Neck:     Thyroid: No thyromegaly.  Cardiovascular:     Rate and Rhythm: Normal rate and regular rhythm.     Heart sounds: Normal heart sounds. No murmur heard.   Pulmonary:  Effort: Pulmonary effort is normal. No respiratory distress.     Breath sounds: Normal breath sounds. No wheezing or rhonchi.  Abdominal:     General: Bowel sounds are normal. There is no distension.     Palpations: Abdomen is soft. There is no mass.     Tenderness: There is no abdominal tenderness.  Musculoskeletal:     Cervical back: Neck supple.  Lymphadenopathy:     Cervical: No cervical adenopathy.  Skin:    General: Skin is warm and dry.  Neurological:     Mental Status: She is alert and oriented to person, place, and time.     Motor: No abnormal muscle tone.     Coordination: Coordination normal.  Psychiatric:        Behavior: Behavior normal.      A/P:  1. Annual physical exam - PAP UTD - vision exam UTD, dental exam today - discussed importance of regular CV exercise, healthy diet, adequate sleep - immunizations UTD - CBC - Basic metabolic panel - AST - ALT - VITAMIN D 25 Hydroxy (Vit-D Deficiency, Fractures) - Lipid panel - next CPE in 1 year  2. Mild persistent asthma without complication - stable, no recent exacerbations - cont current med  3. Common migraine with intractable migraine - follows regularly with neuro - cont current meds  4. Vitamin D deficiency - VITAMIN D 25 Hydroxy (Vit-D Deficiency, Fractures)  5. Acne vulgaris Rx: - adapalene (DIFFERIN) 0.1 % gel; Apply topically at bedtime.  Dispense: 45 g; Refill: 2 - Benzoyl Peroxide (BENZOYL PEROXIDE) 10 % LIQD; Use daily in shower on affected areas  Dispense: 187 g; Refill: 3   This visit occurred during the SARS-CoV-2 public health emergency.   Safety protocols were in place, including screening questions prior to the visit, additional usage of staff PPE, and extensive cleaning of exam room while observing appropriate contact time as indicated for disinfecting solutions.

## 2020-03-31 ENCOUNTER — Encounter: Payer: Self-pay | Admitting: Family Medicine

## 2020-04-07 MED ORDER — NORETHINDRONE 0.35 MG PO TABS: 1.0000 | ORAL_TABLET | Freq: Every day | ORAL | 0 refills | Status: DC

## 2020-04-20 ENCOUNTER — Encounter: Payer: Self-pay | Admitting: Family Medicine

## 2020-04-30 ENCOUNTER — Other Ambulatory Visit: Payer: Self-pay | Admitting: Obstetrics & Gynecology

## 2020-05-20 ENCOUNTER — Other Ambulatory Visit: Payer: Self-pay | Admitting: Obstetrics & Gynecology

## 2020-07-07 ENCOUNTER — Telehealth: Payer: Self-pay

## 2020-07-07 ENCOUNTER — Ambulatory Visit (INDEPENDENT_AMBULATORY_CARE_PROVIDER_SITE_OTHER): Payer: 59 | Admitting: Obstetrics & Gynecology

## 2020-07-07 ENCOUNTER — Encounter: Payer: Self-pay | Admitting: Obstetrics & Gynecology

## 2020-07-07 ENCOUNTER — Other Ambulatory Visit: Payer: Self-pay

## 2020-07-07 VITALS — BP 126/82 | Ht 66.0 in | Wt 232.0 lb

## 2020-07-07 DIAGNOSIS — Z113 Encounter for screening for infections with a predominantly sexual mode of transmission: Secondary | ICD-10-CM

## 2020-07-07 DIAGNOSIS — Z1501 Genetic susceptibility to malignant neoplasm of breast: Secondary | ICD-10-CM

## 2020-07-07 DIAGNOSIS — Z1151 Encounter for screening for human papillomavirus (HPV): Secondary | ICD-10-CM | POA: Diagnosis not present

## 2020-07-07 DIAGNOSIS — Z8041 Family history of malignant neoplasm of ovary: Secondary | ICD-10-CM

## 2020-07-07 DIAGNOSIS — Z3046 Encounter for surveillance of implantable subdermal contraceptive: Secondary | ICD-10-CM

## 2020-07-07 DIAGNOSIS — Z23 Encounter for immunization: Secondary | ICD-10-CM

## 2020-07-07 DIAGNOSIS — Z1509 Genetic susceptibility to other malignant neoplasm: Secondary | ICD-10-CM

## 2020-07-07 DIAGNOSIS — Z01419 Encounter for gynecological examination (general) (routine) without abnormal findings: Secondary | ICD-10-CM | POA: Diagnosis not present

## 2020-07-07 DIAGNOSIS — Z803 Family history of malignant neoplasm of breast: Secondary | ICD-10-CM

## 2020-07-07 MED ORDER — FLOVENT HFA 110 MCG/ACT IN AERO: 1.0000 | INHALATION_SPRAY | Freq: Two times a day (BID) | RESPIRATORY_TRACT | 5 refills | Status: DC

## 2020-07-07 NOTE — Telephone Encounter (Signed)
I sent Rx for this in 08/2019. Is pt still using regularly? If so, will send Rx for flovent

## 2020-07-07 NOTE — Telephone Encounter (Signed)
Patient states that she hasn't had to use it regularly but has noticed that in the morning she has needed to use it here recently. She was just wanting to get a refill before she uses the last of what she has.  She is okay with medication change.  Please advise.  Thanks.  Dm/cma

## 2020-07-07 NOTE — Progress Notes (Signed)
Carloyn Branam [birth year] [id number]   History:    29 y.o. G0  New partner.  RP:  Established patient presenting for annual gyn exam   HPI: Well on Nexplanon x 01/2019.  Spaced light menses.  No pelvic pain.  New partner.  No pain with IC.  No vaginal d/c.  No fever.  BrCa2 pos.  Fam h/o Breast Ca and Ovarian Ca.  Breasts normal.  Screening mammo 09/2019 Neg.  Urine/BMs normal. BMI 37.45.    Past medical history,surgical history, family history and social history were all reviewed and documented in the EPIC chart.  Gynecologic History Patient's last menstrual period was 06/23/2020.  Obstetric History OB History  Gravida Para Term Preterm AB Living  0 0 0 0 0 0  SAB TAB Ectopic Multiple Live Births  0 0 0 0 0     ROS: A ROS was performed and pertinent positives and negatives are included in the history.  GENERAL: No fevers or chills. HEENT: No change in vision, no earache, sore throat or sinus congestion. NECK: No pain or stiffness. CARDIOVASCULAR: No chest pain or pressure. No palpitations. PULMONARY: No shortness of breath, cough or wheeze. GASTROINTESTINAL: No abdominal pain, nausea, vomiting or diarrhea, melena or bright red blood per rectum. GENITOURINARY: No urinary frequency, urgency, hesitancy or dysuria. MUSCULOSKELETAL: No joint or muscle pain, no back pain, no recent trauma. DERMATOLOGIC: No rash, no itching, no lesions. ENDOCRINE: No polyuria, polydipsia, no heat or cold intolerance. No recent change in weight. HEMATOLOGICAL: No anemia or easy bruising or bleeding. NEUROLOGIC: No headache, seizures, numbness, tingling or weakness. PSYCHIATRIC: No depression, no loss of interest in normal activity or change in sleep pattern.     Exam:   BP 126/82   Ht 5' 6"  (1.676 m)   Wt 232 lb (105.2 kg)   LMP 06/23/2020 Comment: NEXPLANON  BMI 37.45 kg/m   Body mass index is 37.45 kg/m.  General appearance : Well developed well nourished female. No acute distress HEENT: Eyes:  no retinal hemorrhage or exudates,  Neck supple, trachea midline, no carotid bruits, no thyroidmegaly Lungs: Clear to auscultation, no rhonchi or wheezes, or rib retractions  Heart: Regular rate and rhythm, no murmurs or gallops Breast:Examined in sitting and supine position were symmetrical in appearance, no palpable masses or tenderness,  no skin retraction, no nipple inversion, no nipple discharge, no skin discoloration, no axillary or supraclavicular lymphadenopathy Abdomen: no palpable masses or tenderness, no rebound or guarding Extremities: no edema or skin discoloration or tenderness  Pelvic: Vulva: Normal             Vagina: No gross lesions or discharge  Cervix: No gross lesions or discharge.  Pap reflex done with Gono-Chlam.  Uterus  AV, normal size, shape and consistency, non-tender and mobile  Adnexa  Without masses or tenderness  Anus: Normal   Assessment/Plan:  29 y.o. female for annual exam   1. Encounter for routine gynecological examination with Papanicolaou smear of cervix Normal gynecologic exam.  Pap reflex done.  Breast exam normal.  Body mass index 37.45.  Recommend a lower calorie/carb diet.  Aerobic activities 5 times a week and light weightlifting every 2 days. - Pap IG, CT/NG NAA, and HPV (high risk)  2. Special screening examination for human papillomavirus (HPV) - Pap IG, CT/NG NAA, and HPV (high risk)  3. Encounter for surveillance of implantable subdermal contraceptive Well on Nexplanon since June 2020.  4. Screening examination for venereal disease Strongly recommend condom  use. - Pap IG, CT/NG NAA, and HPV (high risk) - HIV antibody (with reflex) - RPR - Hepatitis B Surface AntiGEN - Hepatitis C Antibody  5. BRCA2 positive Normal breast exam and gynecologic exam.  Follow-up pelvic ultrasound as screening for ovarian cancer.  Screening mammogram February 2021 was negative. - US Transvaginal Non-OB; Future  6. Family history of breast cancer As  above.  7. Family history of ovarian cancer As above. - US Transvaginal Non-OB; Future  8. Flu vaccine need Flu shot given.  Other orders - Flu Vaccine QUAD 36+ mos IM (Fluarix, Quad PF)  Princess Bruins MD, 12:08 PM 07/07/2020

## 2020-07-07 NOTE — Telephone Encounter (Signed)
flovent sent to pts pharmacy

## 2020-07-07 NOTE — Addendum Note (Signed)
Addended by: Overton Mam on: 07/07/2020 04:13 PM   Modules accepted: Orders

## 2020-07-07 NOTE — Telephone Encounter (Signed)
Received a message from pharmacy stating that the Qvar isn't covered by insurance.   Alternative meds covered are:  Arnuity Ellipta, Flovent HFA, Flovent Diskus, Pulmicort Flexhaler.    Please review and advise.  Thanks.  Dm/cma

## 2020-07-08 LAB — HEPATITIS C ANTIBODY
Hepatitis C Ab: NONREACTIVE
SIGNAL TO CUT-OFF: 0.02 (ref <1.00)

## 2020-07-08 LAB — HIV ANTIBODY (ROUTINE TESTING W REFLEX): HIV 1&2 Ab, 4th Generation: NONREACTIVE

## 2020-07-08 LAB — HEPATITIS B SURFACE ANTIGEN: Hepatitis B Surface Ag: NONREACTIVE

## 2020-07-08 LAB — RPR: RPR Ser Ql: NONREACTIVE

## 2020-07-11 ENCOUNTER — Encounter: Payer: Self-pay | Admitting: Obstetrics & Gynecology

## 2020-07-12 LAB — PAP IG, CT-NG NAA, HPV HIGH-RISK
C. trachomatis RNA, TMA: NOT DETECTED
HPV DNA High Risk: NOT DETECTED
N. gonorrhoeae RNA, TMA: NOT DETECTED

## 2020-07-28 ENCOUNTER — Encounter: Payer: Self-pay | Admitting: Family Medicine

## 2020-07-28 DIAGNOSIS — Z1509 Genetic susceptibility to other malignant neoplasm: Secondary | ICD-10-CM

## 2020-07-28 DIAGNOSIS — Z1239 Encounter for other screening for malignant neoplasm of breast: Secondary | ICD-10-CM

## 2020-07-28 DIAGNOSIS — Z803 Family history of malignant neoplasm of breast: Secondary | ICD-10-CM

## 2020-07-28 DIAGNOSIS — Z1501 Genetic susceptibility to malignant neoplasm of breast: Secondary | ICD-10-CM

## 2020-08-04 ENCOUNTER — Encounter: Payer: Self-pay | Admitting: Obstetrics & Gynecology

## 2020-08-04 ENCOUNTER — Ambulatory Visit (INDEPENDENT_AMBULATORY_CARE_PROVIDER_SITE_OTHER): Payer: 59 | Admitting: Obstetrics & Gynecology

## 2020-08-04 ENCOUNTER — Ambulatory Visit (INDEPENDENT_AMBULATORY_CARE_PROVIDER_SITE_OTHER): Payer: 59

## 2020-08-04 ENCOUNTER — Other Ambulatory Visit: Payer: Self-pay

## 2020-08-04 VITALS — BP 118/80

## 2020-08-04 DIAGNOSIS — Z1501 Genetic susceptibility to malignant neoplasm of breast: Secondary | ICD-10-CM

## 2020-08-04 DIAGNOSIS — N83202 Unspecified ovarian cyst, left side: Secondary | ICD-10-CM

## 2020-08-04 DIAGNOSIS — Z8041 Family history of malignant neoplasm of ovary: Secondary | ICD-10-CM

## 2020-08-04 DIAGNOSIS — Z1509 Genetic susceptibility to other malignant neoplasm: Secondary | ICD-10-CM

## 2020-08-04 DIAGNOSIS — N854 Malposition of uterus: Secondary | ICD-10-CM

## 2020-08-04 NOTE — Progress Notes (Signed)
    Evalin Shawhan Sep 18, 1990 [id number]        29 y.o.  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  BrCa2 pos.  Fam h/o Breast Ca and Ovarian Ca.  Breasts normal.  Screening mammo 09/2019 Neg. Well on Nexplanon x 01/2019.  Spaced light menses.  No pelvic pain.  New partner.  No pain with IC.  No vaginal d/c. Urine/BMs normal.   OB History  Gravida Para Term Preterm AB Living  0 0 0 0 0 0  SAB TAB Ectopic Multiple Live Births  0 0 0 0 0    Past medical history,surgical history, problem list, medications, allergies, family history and social history were all reviewed and documented in the EPIC chart.   Directed ROS with pertinent positives and negatives documented in the history of present illness/assessment and plan.  Exam:  Vitals:   08/04/20 1237  BP: 118/80   General appearance:  Normal  Pelvic US today: T/V images.  Anteverted uterus normal in size and shape with no myometrial mass.  The uterus is measured at 7.7 x 3.24 x 2.97 cm.  The endometrial lining is thin and symmetrical with no mass or thickening seen.  The endometrial lining is measured at 6.39 mm.  Both ovaries are normal in size with normal follicular patterns.  The left ovary presents and echo-free, thin walled, avascular cyst measured at 3.1 x 3.3 cm.  No adnexal mass.  No free fluid in the posterior cul-de-sac.   Assessment/Plan:  29 y.o. G0  1. BRCA2 positive Patient is asymptomatic.  Pelvic ultrasound thoroughly reviewed with patient.  The uterus is normal in size and appearance with normal endometrial lining at 6.39 mm.  Both ovaries are normal in size with normal follicular pattern.  The left ovary presents a small simple cyst measured at 3.3 cm, it is echo-free, thin-walled, avascular.  No adnexal mass.  No free fluid in the posterior cul-de-sac.  Patient was reassured.  We will follow-up for annual exam next year.  We will repeat the pelvic ultrasound annually.  2.  Family history of ovarian cancer As above.  Princess Bruins MD, 12:44 PM 08/04/2020

## 2020-08-09 ENCOUNTER — Encounter: Payer: Self-pay | Admitting: Obstetrics & Gynecology

## 2020-08-18 ENCOUNTER — Ambulatory Visit
Admission: RE | Admit: 2020-08-18 | Discharge: 2020-08-18 | Disposition: A | Discharge status: Home / Self Care | Payer: 59 | Source: Ambulatory Visit | Attending: Family Medicine | Admitting: Family Medicine

## 2020-08-18 ENCOUNTER — Other Ambulatory Visit: Payer: Self-pay

## 2020-08-18 DIAGNOSIS — Z1239 Encounter for other screening for malignant neoplasm of breast: Secondary | ICD-10-CM

## 2020-08-18 DIAGNOSIS — Z1509 Genetic susceptibility to other malignant neoplasm: Secondary | ICD-10-CM

## 2020-08-18 DIAGNOSIS — Z803 Family history of malignant neoplasm of breast: Secondary | ICD-10-CM

## 2020-08-18 MED ORDER — GADOBUTROL 1 MMOL/ML IV SOLN
10.0000 mL | Freq: Once | INTRAVENOUS | Status: AC | PRN
  Administered 2020-08-18: 10 mL via INTRAVENOUS

## 2020-08-25 ENCOUNTER — Telehealth: Payer: Self-pay | Admitting: Family Medicine

## 2020-08-25 DIAGNOSIS — Z1501 Genetic susceptibility to malignant neoplasm of breast: Secondary | ICD-10-CM

## 2020-08-25 DIAGNOSIS — R928 Other abnormal and inconclusive findings on diagnostic imaging of breast: Secondary | ICD-10-CM

## 2020-08-25 DIAGNOSIS — Z803 Family history of malignant neoplasm of breast: Secondary | ICD-10-CM

## 2020-08-25 NOTE — Telephone Encounter (Signed)
Patient is calling in regards to her MRI. She received her results via MyChart and has some questions. I advised her that the provider has to sign off on them before we could call with any results. Please give her a call back at [phone].

## 2020-08-25 NOTE — Telephone Encounter (Signed)
Patient advised that provider is out of the office and will review result upon returning.  Dm/cma

## 2020-08-30 NOTE — Progress Notes (Signed)
NEUROLOGY FOLLOW UP OFFICE NOTE  Bethany Robinson [id number]   Subjective:  Bethany Robinson is a 30year old right-handed female with anxiety who follows up for migraines and pain in fingertips.  UPDATE: She was unable to get Emgality.  Apparently, it was not approved by her insurance and the pharmacist said she cannot use the copay card if the insurance did not approve the medication.  Headaches did improve but have increased.  She restarted the [facility] in September Intensity:6/10 Duration:2 hours Frequency:2 a month Rescue therapy: Ibuprofen, rarely rizatriptan Current NSAIDS:Aleve, ibuprofen Current analgesics:Excedrin Current triptans:Rizatriptan 7m Current ergotamine:none Current anti-emetic:none Current muscle relaxants:none Current anti-anxiolytic:none Current sleep aide:trazodone Current Antihypertensive medications:clonidine Current Antidepressant medications:bupropion 3059mdoxepin;trazodone Current Anticonvulsant medications:none Current anti-CGRP:Emgality Current Vitamins/Herbal/Supplements:D, B12 (sometimes) Current Antihistamines/Decongestants:Zyrtec Other therapy:none Hormone/birth control:Nexplanon  When she was having an MRI last month, she had numbness in her hands, involving the first 3 digits of both hands.  Afterwards it resolved after she finished the study.  It has not recurred.   Caffeine:1-2 cups of coffee or 1 soda 3 to 4 days a week Alcohol:Socially (2 to 3 drinks a week) Smoker:no Diet:Needs to increase water intake Exercise:no Depression:yes; Anxiety:yes Other pain:general Sleep hygiene:suboptimal  HISTORY: She has history of various neurologic symptomssince 2018, such asdizziness,numbness and tinglingof hands feet, and legs, word-finding difficulty/stutteringand fatigue. She has tremors in hand and has trouble with dexterity and fine-finger movements. Symptoms are  intermittent. She had workup performed by a previous neurologist, which was negative. MRI of brain with and without contrast from 10/02/17 was normal. Symptoms subsequently resolved. On 04/13/19, she presented to the ED for full-bodytwitching and muscle spasms that started after ingesting marijuana-laced gummy bear, which she has eaten in the past without complication. She was treated with IVF and Ativan. Following this event, she started to experience her previous symptoms, such as fatigue, paresthesias and word-finding difficulty.B12 316; TSH 2.380. Headaches have gotten worse as well.  She has several headaches: She reports ice-pick headaches: Usually right-temple but may vary in location. They are severe stabbing pain. However she has a constant non-throbbing pain in various locations (right sided, behind head or behind eye).  Her typical migraines, since middle school, are a severe right occipital throbbing headache associated photophobia, phonophobia, sometimes nausea but no vomiting, visual disturbance or unilateral numbness and weakness. They typically last 2 hours and occur maybe 2 to 3 times month. No specific triggers. They are relieved with analgesics and sleep.  In early 2021, she started experiencing pain in all of her fingertips, described as a "deep bruising" pain, not a burning, stinging, or electric pain.  It often is aggravated when applying any degree of pressure on her fingertips.  No associated numbness or tingling.  She reports difficulty holding objects in her hands.  Sometimes her fingers turn white.  She has tension in her shoulders but no neck pain..  Symptoms somewhat improved after getting a neck adjustment by a chiropractor but have not resolved.  She notes some mild tingling along the outside of her ankles and feet.  Rheumatologic workup from January, including ANA, ds-DNA, Scl-70, sed rate 5, and CRP 3, were unremarkable.  Labs from August 2020 included TSH  2.380.  Labs included B12 576, vitamin D 19.8 (started supplement).  She did not tolerate Cymbalta.  I did not have an explanation for her symptoms.  Past NSAIDS:Naproxen 50042mast analgesics:Tylenol Past abortive triptans:Sumatriptan 57m68mst abortive ergotamine:none Past muscle relaxants:none Past anti-emetic:none Past antihypertensive medications:none Past antidepressant medications:venlafaxine XR  67m, Cymbalta 64mPast anticonvulsant medications:topiramate 7560msomewhat helpful butcognitive deficits), gabapentin100m75m AM and 200mg97mPM Past anti-CGRP:none Past vitamins/Herbal/Supplements:none Past antihistamines/decongestants:none Other past therapies:none   Family history of headache:Father (migraine with aura such as right sided body numbness) Other family history:  Paternal grandmother (CREST syndrome)  PAST MEDICAL HISTORY: Past Medical History:  Diagnosis Date  . Anxiety   . Common migraine with intractable migraine 09/27/2017  . Depression   . Family history of BRCA2 gene positive   . Family history of breast cancer     MEDICATIONS: Current Outpatient Medications on File Prior to Visit  Medication Sig Dispense Refill  . adapalene (DIFFERIN) 0.1 % gel Apply topically at bedtime. 45 g 2  . albuterol (VENTOLIN HFA) 108 (90 Base) MCG/ACT inhaler Inhale 2 puffs into the lungs every 6 (six) hours as needed for wheezing or shortness of breath. 8 g 2  . BuPROPion HBr (APLENZIN) 348 MG TB24 Take 1 tablet by mouth daily.    . Cetirizine HCl (ZYRTEC PO) Take 10 mg by mouth daily.     . cloNIDine (CATAPRES) 0.1 MG tablet Take 0.1 mg by mouth daily.    . doxMarland Kitchenpin (SINEQUAN) 100 MG capsule Take 1 capsule (100 mg total) by mouth at bedtime. 90 capsule 3  . etonogestrel (NEXPLANON) 68 MG IMPL implant 1 each by Subdermal route once.    . fluticasone (FLOVENT HFA) 110 MCG/ACT inhaler Inhale 1 puff into the lungs in the morning and at bedtime. 1  each 5  . Galcanezumab-gnlm (EMGALITY) 120 MG/ML SOAJ Inject 120 mg into the skin every 28 (twenty-eight) days. 1 pen 11  . rizatriptan (MAXALT) 10 MG tablet Take 10 mg by mouth as needed for migraine. May repeat in 2 hours if needed    . Vitamin D, Ergocalciferol, (DRISDOL) 1.25 MG (50000 UNIT) CAPS capsule Take 1 capsule (50,000 Units total) by mouth every 7 (seven) days. 5 capsule 2   Current Facility-Administered Medications on File Prior to Visit  Medication Dose Route Frequency Provider Last Rate Last Admin  . gadopentetate dimeglumine (MAGNEVIST) injection 18 mL  18 mL Intravenous Once PRN WilliKathrynn Ducking       ALLERGIES: Allergies  Allergen Reactions  . Hydrocodone Swelling  . Topamax [Topiramate]     Cognitive slowing    FAMILY HISTORY: Family History  Problem Relation Age of Onset  . Migraines Father   . BRCA 1/2 Mother        BRCA2 pos  . BRCA 1/2 Maternal Aunt        BRCA2 pos  . Breast cancer Maternal Grandmother   . Depression Paternal Grandmother   . Heart failure Paternal Grandmother   . Crohn's disease Paternal Grandmother   . CVA Paternal Grandmother   . Other Paternal Grandmother        CREST   . Sjogren's syndrome Paternal Grandmother   . Breast cancer Other   . Sjogren's syndrome Paternal Great-grandmother   . Heart attack Maternal Great-grandmother 70  .40ypertension Maternal Great-grandmother   . Bladder Cancer Maternal Great-grandmother     SOCIAL HISTORY: Social History   Socioeconomic History  . Marital status: Single    Spouse name: Not on file  . Number of children: 0  . Years of education: 18  .106ighest education level: Not on file  Occupational History  . Occupation: cookout  Tobacco Use  . Smoking status: Never Smoker  . Smokeless tobacco: Never Used  Vaping Use  .  Vaping Use: Never used  Substance and Sexual Activity  . Alcohol use: Yes    Comment: socially  . Drug use: Yes    Comment: THC Gummies  . Sexual activity:  Yes    Partners: Male    Birth control/protection: Implant, None    Comment: 1st intercourse- 21, partners- greater than 10,   Other Topics Concern  . Not on file  Social History Narrative   Right handed   Apartment lives in   Lives alone   No kids   Caffeine 2 cups of coffee a day    Social Determinants of Health   Financial Resource Strain: Not on file  Food Insecurity: Not on file  Transportation Needs: Not on file  Physical Activity: Not on file  Stress: Not on file  Social Connections: Not on file  Intimate Partner Violence: Not on file     Objective:  Blood pressure 118/81, pulse 70, height _0  (1.676 m), weight 234 lb (106.1 kg), SpO2 98 %. General: No acute distress.  Patient appears well-groomed.      Assessment/Plan:   1. Migraine without aura,without status migrainosus, not intractable 2.  Migraine with aura, without status migrainosus, not intractable 3.  Primary stabbing headache 4.  Numbness in hands during MRI - likely related to arm position - no recurrence.  If returns, consider NCV-EMG 1.  Headache prevention:  Emgality (refilled) 2.  Headache rescue:  Ibuprofen or rizatriptan 15m 3.  Limit use of pain relievers to no more than 2 days out of week to prevent risk of rebound or medication-overuse headache. 4.  Keep headache diary 5.  Follow up one year  AMetta Clines DO  CC: MLetta Median DO

## 2020-08-31 ENCOUNTER — Encounter: Payer: Self-pay | Admitting: Neurology

## 2020-08-31 NOTE — Progress Notes (Signed)
Bethany Robinson Key: [id number] - PA Case ID: [id number] Need help? Call us at [phone] Outcome Approvedtoday Request Reference Number: [id number]. EMGALITY INJ 120MG /ML is approved through 08/31/2021. Your patient may now fill this prescription and it will be covered. Drug Emgality 120MG /ML auto-injectors (migraine) Form [employer] Electronic Prior Authorization Form (2017 NCPDP)

## 2020-09-01 ENCOUNTER — Other Ambulatory Visit: Payer: Self-pay

## 2020-09-01 ENCOUNTER — Ambulatory Visit: Payer: 59 | Admitting: Neurology

## 2020-09-01 ENCOUNTER — Encounter: Payer: Self-pay | Admitting: Neurology

## 2020-09-01 VITALS — BP 118/81 | HR 70 | Ht 66.0 in | Wt 234.0 lb

## 2020-09-01 DIAGNOSIS — G43109 Migraine with aura, not intractable, without status migrainosus: Secondary | ICD-10-CM

## 2020-09-01 MED ORDER — EMGALITY 120 MG/ML ~~LOC~~ SOAJ: 120.0000 mg | SUBCUTANEOUS | 5 refills | Status: DC

## 2020-09-01 NOTE — Telephone Encounter (Signed)
Please call pt to let her know Rt breast normal. Lt breast shows a 1.4cm area (not a mass) that was not there in 2020 so biopsy is recommended. I placed order for this to [facility] and they will call pt to schedule

## 2020-09-01 NOTE — Telephone Encounter (Signed)
Patient notified VIA phone.  No questions.  Dm/cma ? ?

## 2020-09-03 ENCOUNTER — Other Ambulatory Visit: Payer: Self-pay | Admitting: Family Medicine

## 2020-09-03 DIAGNOSIS — Z803 Family history of malignant neoplasm of breast: Secondary | ICD-10-CM

## 2020-09-03 DIAGNOSIS — Z1509 Genetic susceptibility to other malignant neoplasm: Secondary | ICD-10-CM

## 2020-09-03 DIAGNOSIS — R928 Other abnormal and inconclusive findings on diagnostic imaging of breast: Secondary | ICD-10-CM

## 2020-09-03 DIAGNOSIS — Z1501 Genetic susceptibility to malignant neoplasm of breast: Secondary | ICD-10-CM

## 2020-09-10 ENCOUNTER — Other Ambulatory Visit: Payer: Self-pay | Admitting: Family Medicine

## 2020-09-10 ENCOUNTER — Other Ambulatory Visit: Payer: Self-pay

## 2020-09-10 ENCOUNTER — Ambulatory Visit
Admission: RE | Admit: 2020-09-10 | Discharge: 2020-09-10 | Disposition: A | Discharge status: Home / Self Care | Payer: 59 | Source: Ambulatory Visit | Attending: Family Medicine | Admitting: Family Medicine

## 2020-09-10 ENCOUNTER — Ambulatory Visit: Admission: RE | Admit: 2020-09-10 | Payer: 59 | Source: Ambulatory Visit

## 2020-09-10 DIAGNOSIS — Z1501 Genetic susceptibility to malignant neoplasm of breast: Secondary | ICD-10-CM

## 2020-09-10 DIAGNOSIS — R928 Other abnormal and inconclusive findings on diagnostic imaging of breast: Secondary | ICD-10-CM

## 2020-09-10 DIAGNOSIS — Z803 Family history of malignant neoplasm of breast: Secondary | ICD-10-CM

## 2020-09-10 MED ORDER — GADOBUTROL 1 MMOL/ML IV SOLN
10.0000 mL | Freq: Once | INTRAVENOUS | Status: AC | PRN
  Administered 2020-09-10: 10 mL via INTRAVENOUS

## 2020-11-10 ENCOUNTER — Other Ambulatory Visit: Payer: Self-pay | Admitting: Family Medicine

## 2020-11-10 DIAGNOSIS — J4520 Mild intermittent asthma, uncomplicated: Secondary | ICD-10-CM

## 2021-01-25 ENCOUNTER — Other Ambulatory Visit: Payer: Self-pay | Admitting: Family Medicine

## 2021-01-25 DIAGNOSIS — Z1231 Encounter for screening mammogram for malignant neoplasm of breast: Secondary | ICD-10-CM

## 2021-02-25 ENCOUNTER — Other Ambulatory Visit: Payer: Self-pay

## 2021-02-25 ENCOUNTER — Encounter: Payer: Self-pay | Admitting: Family Medicine

## 2021-02-25 ENCOUNTER — Ambulatory Visit: Payer: 59 | Admitting: Family Medicine

## 2021-02-25 VITALS — BP 108/80 | HR 94 | Temp 98.3°F | Ht 66.0 in | Wt 223.0 lb

## 2021-02-25 DIAGNOSIS — M797 Fibromyalgia: Secondary | ICD-10-CM

## 2021-02-25 MED ORDER — GABAPENTIN 100 MG PO CAPS: 300.0000 mg | ORAL_CAPSULE | Freq: Two times a day (BID) | ORAL | 1 refills | Status: DC

## 2021-02-25 NOTE — Progress Notes (Signed)
Bethany Robinson is a 30 y.o. female  Chief Complaint  Patient presents with   Acute Visit    Pt c/o nerve pain for years but worse in the past 2 months. Pt states her hands and feet bother her the most but has noticed if she is bumped or hit any area of her body she has deep sore pain.     HPI: Bethany Robinson is a 30 y.o. female patient who complains of chronic pain mostly in her hands and feet, worse in the past 2 mo or so. She states especially if she bumps or hit any area of her body that it is extremely sore. Not temperature related. 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 No easy bruising. Pts muscles - thighs, upper arms, back - feel sore/achy a lot of the time.  Pt is at times limited in her activities d/t above symptoms.  She is working on eating better and losing weight. She was swimming for exercise which made a very little improvement.  She is sleeping well w/ doxepin.  She is on emgality for headaches. She is having them less frequently.  She tried gabapentin with Dr. Jannifer Franklin in the past for headaches.  She was tried on lyrica, cymbalta in the past.   Past Medical History:  Diagnosis Date   Anxiety    Common migraine with intractable migraine 09/27/2017   Depression    Family history of BRCA2 gene positive    Family history of breast cancer     Past Surgical History:  Procedure Laterality Date   TONSILLECTOMY      Social History   Socioeconomic History   Marital status: Single    Spouse name: Not on file   Number of children: 0   Years of education: 84   Highest education level: Not on file  Occupational History   Occupation: cookout  Tobacco Use   Smoking status: Never   Smokeless tobacco: Never  Vaping Use   Vaping Use: Never used  Substance and Sexual Activity   Alcohol use: Yes    Comment: socially   Drug use: Yes    Comment: THC Gummies   Sexual activity: Yes    Partners: Male    Birth control/protection: Implant, None     Comment: 1st intercourse- 21, partners- greater than 81,   Other Topics Concern   Not on file  Social History Narrative   Right handed   Apartment lives in   Lives alone   No kids   Caffeine 2 cups of coffee a day    Social Determinants of Health   Financial Resource Strain: Not on file  Food Insecurity: Not on file  Transportation Needs: Not on file  Physical Activity: Not on file  Stress: Not on file  Social Connections: Not on file  Intimate Partner Violence: Not on file    Family History  Problem Relation Age of Onset   Migraines Father    BRCA 1/2 Mother        BRCA2 pos   BRCA 1/2 Maternal Aunt        BRCA2 pos   Breast cancer Maternal Grandmother    Depression Paternal Grandmother    Heart failure Paternal Grandmother    Crohn's disease Paternal Grandmother    CVA Paternal Grandmother    Other Paternal Grandmother        CREST    Sjogren's syndrome Paternal Grandmother    Breast cancer Other    Sjogren's  syndrome Paternal Great-grandmother    Heart attack Maternal Great-grandmother 52   Hypertension Maternal Great-grandmother    Bladder Cancer Maternal Great-grandmother      Immunization History  Administered Date(s) Administered   Influenza Inj Mdck Quad Pf 05/31/2017   Influenza,inj,Quad PF,6+ Mos 10/04/2018, 05/17/2019, 07/07/2020   Influenza-Unspecified 10/04/2018, 05/29/2019   Moderna Sars-Covid-2 Vaccination 11/17/2019, 12/15/2019, 08/10/2020   Tdap 05/17/2019    Outpatient Encounter Medications as of 02/25/2021  Medication Sig   albuterol (VENTOLIN HFA) 108 (90 Base) MCG/ACT inhaler TAKE 2 PUFFS BY MOUTH EVERY 6 HOURS AS NEEDED FOR WHEEZE OR SHORTNESS OF BREATH   BuPROPion HBr (APLENZIN) 348 MG TB24 Take 1 tablet by mouth daily.   Cetirizine HCl (ZYRTEC PO) Take 10 mg by mouth daily.    cloNIDine (CATAPRES) 0.1 MG tablet Take 0.1 mg by mouth daily.   doxepin (SINEQUAN) 100 MG capsule Take 1 capsule (100 mg total) by mouth at bedtime.    etonogestrel (NEXPLANON) 68 MG IMPL implant 1 each by Subdermal route once.   fluticasone (FLOVENT HFA) 110 MCG/ACT inhaler Inhale 1 puff into the lungs in the morning and at bedtime.   gabapentin (NEURONTIN) 100 MG capsule Take 3 capsules (300 mg total) by mouth 2 (two) times daily.   Galcanezumab-gnlm (EMGALITY) 120 MG/ML SOAJ Inject 120 mg into the skin every 28 (twenty-eight) days.   LORazepam (ATIVAN) 0.5 MG tablet Take 0.5 mg by mouth continuous as needed for anxiety.   rizatriptan (MAXALT) 10 MG tablet Take 10 mg by mouth as needed for migraine. May repeat in 2 hours if needed   [DISCONTINUED] Vitamin D, Ergocalciferol, (DRISDOL) 1.25 MG (50000 UNIT) CAPS capsule Take 1 capsule (50,000 Units total) by mouth every 7 (seven) days. (Patient not taking: Reported on 02/25/2021)   Facility-Administered Encounter Medications as of 02/25/2021  Medication   gadopentetate dimeglumine (MAGNEVIST) injection 18 mL     ROS: Pertinent positives and negatives noted in HPI. Remainder of ROS non-contributory    Allergies  Allergen Reactions   Hydrocodone Swelling   Topamax [Topiramate]     Cognitive slowing    BP 108/80 (BP Location: Right Arm, Patient Position: Sitting, Cuff Size: Large)   Pulse 94   Temp 98.3 F (36.8 C) (Temporal)   Ht 5' 6"  (1.676 m)   Wt 223 lb (101.2 kg)   SpO2 98%   BMI 35.99 kg/m  Wt Readings from Last 3 Encounters:  02/25/21 223 lb (101.2 kg)  09/01/20 234 lb (106.1 kg)  07/07/20 232 lb (105.2 kg)   Temp Readings from Last 3 Encounters:  02/25/21 98.3 F (36.8 C) (Temporal)  03/17/20 98 F (36.7 C) (Temporal)  01/05/20 (!) 97 F (36.1 C)   BP Readings from Last 3 Encounters:  02/25/21 108/80  09/01/20 118/81  08/04/20 118/80   Pulse Readings from Last 3 Encounters:  02/25/21 94  09/01/20 70  03/17/20 81    Physical Exam Constitutional:      General: She is not in acute distress.    Appearance: She is not ill-appearing.  Skin:    Coloration:  Skin is not pale.     Findings: No bruising or rash.  Neurological:     Mental Status: She is alert and oriented to person, place, and time.     Sensory: No sensory deficit.     Coordination: Coordination normal.     Gait: Gait normal.     A/P:  1. Fibromyalgia - has not been formally dx by rheum. Could  also be psychosomatic or RSD/complex regional pain syndrome - pt has been on cymbalta and lyrica in the past, either not effective or side effects - she on on gabapentin 134m qAM and 2059mqPM in the past for headaches - did not help headaches - but she is agreeable to trying for current symptoms Rx: - gabapentin (NEURONTIN) 100 MG capsule; Take 3 capsules (300 mg total) by mouth 2 (two) times daily.  Dispense: 180 capsule; Refill: 1 - continue with weight loss and exercise (aquatic therapy is tolerable for pt) - heat/ice - consider biofeedback - f/u in 3-4 wks or sooner PRN Discussed plan and reviewed medications with patient, including risks, benefits, and potential side effects. Pt expressed understand. All questions answered.  This visit occurred during the SARS-CoV-2 public health emergency.  Safety protocols were in place, including screening questions prior to the visit, additional usage of staff PPE, and extensive cleaning of exam room while observing appropriate contact time as indicated for disinfecting solutions.

## 2021-02-25 NOTE — Patient Instructions (Signed)
200mg  at bedtime x 1 week then increase to 300mg  (3 tabs) at bedtime Then after 3-5 days, start 200mg  in AM, after 1 week increase to 300mg  in AM with goal of 300mg  2x/day

## 2021-03-15 ENCOUNTER — Encounter: Payer: Self-pay | Admitting: Family Medicine

## 2021-03-22 ENCOUNTER — Other Ambulatory Visit: Payer: Self-pay

## 2021-03-22 ENCOUNTER — Ambulatory Visit
Admission: RE | Admit: 2021-03-22 | Discharge: 2021-03-22 | Disposition: A | Discharge status: Home / Self Care | Payer: 59 | Source: Ambulatory Visit | Attending: Family Medicine | Admitting: Family Medicine

## 2021-03-22 DIAGNOSIS — Z1231 Encounter for screening mammogram for malignant neoplasm of breast: Secondary | ICD-10-CM

## 2021-05-30 ENCOUNTER — Ambulatory Visit: Payer: 59 | Admitting: Family Medicine

## 2021-05-30 ENCOUNTER — Encounter: Payer: Self-pay | Admitting: Family Medicine

## 2021-05-30 ENCOUNTER — Other Ambulatory Visit: Payer: Self-pay

## 2021-05-30 VITALS — BP 122/84 | HR 84 | Temp 98.2°F | Ht 67.0 in | Wt 223.0 lb

## 2021-05-30 DIAGNOSIS — F418 Other specified anxiety disorders: Secondary | ICD-10-CM | POA: Diagnosis not present

## 2021-05-30 DIAGNOSIS — M791 Myalgia, unspecified site: Secondary | ICD-10-CM | POA: Insufficient documentation

## 2021-05-30 DIAGNOSIS — Z23 Encounter for immunization: Secondary | ICD-10-CM | POA: Diagnosis not present

## 2021-05-30 DIAGNOSIS — G43019 Migraine without aura, intractable, without status migrainosus: Secondary | ICD-10-CM

## 2021-05-30 DIAGNOSIS — J45990 Exercise induced bronchospasm: Secondary | ICD-10-CM | POA: Insufficient documentation

## 2021-05-30 DIAGNOSIS — J302 Other seasonal allergic rhinitis: Secondary | ICD-10-CM | POA: Insufficient documentation

## 2021-05-30 MED ORDER — LORAZEPAM 0.5 MG PO TABS: 0.5000 mg | ORAL_TABLET | ORAL | 0 refills | Status: AC | PRN

## 2021-05-30 NOTE — Progress Notes (Signed)
Bethany Robinson [address] [place] [address] Dept: [phone] Dept Fax: [phone]  Transfer of Care Office Visit  Subjective:    Patient ID: Bethany Robinson, female    DOB: 1991-02-02, 30 y.o..   MRN: [id number]  Chief Complaint  Patient presents with   Establish Care    Bethany Robinson- establish care.  Wants flu shot.      History of Present Illness:  Patient is in today to establish care. Bethany Robinson was born in [place], [place]. She moved to [place] 5 years ago for grad school. She attended [employer], where she obtained her Masters in history. She is in a complicated relationship. She has no children. She works in Press photographer for [employer]. She denies tobacco or drug use and only drinks alcohol socially.  Bethany Robinson has a history of migraine headaches. She currently manages these with galcanezumab (Emgality) injections every 4 weeks. She only rarely now needs to use her rizatriptan (Maxalt).  Bethany Robinson has a history of depression with anxiety. She is currently managed through [facility] by Bethany Robinson (Psych NP). She is currently managed on bupropion, clonidine, and doxepin. She also takes PRN Ativan for acute anxiety issues.  Bethany Robinson has a history of exercise-induced asthma. She has been prescribed Flovent, but is not using this consistently. She is not exercising as much as int he past, so is not using her albuterol very often.  Bethany Robinson has a family history of breast cancer and has tested positive for the BRCA2 gene. She is currently managed with mammograms and breast MRIs 2 times a year.  Bethany Robinson has a history of various symptoms that have been suggestive of possible fibromyalgia. She has diffuse aches and notes when she scratches she feels "bruised" for some time afterwards. She also describes various concerning symptoms, such as ane on the face and notes her mood issues are increased around the time  of her menses. She is currently using Nexplanon (due out in Jan).   Past Medical History: Patient Active Problem List   Diagnosis Date Noted   Exercise-induced asthma 05/30/2021   Depression with anxiety 05/30/2021   Seasonal allergic rhinitis 05/30/2021   Muscle ache 05/30/2021   BRCA2 gene mutation positive 08/09/2018   Family history of breast cancer    Family history of BRCA2 gene positive    Common migraine with intractable migraine 09/27/2017   Past Surgical History:  Procedure Laterality Date   TONSILLECTOMY     Family History  Problem Relation Age of Onset   BRCA 1/2 Mother        BRCA2 pos   Migraines Father    BRCA 1/2 Maternal Aunt        BRCA2 pos   Diabetes Paternal Uncle    Breast cancer Maternal Grandmother    Diabetes Maternal Grandfather    Depression Paternal Grandmother    Heart failure Paternal Grandmother    Crohn's disease Paternal Grandmother    CVA Paternal Grandmother    Other Paternal Grandmother        CREST    Sjogren's syndrome Paternal Grandmother    Sjogren's syndrome Paternal Great-grandmother    Heart attack Maternal Great-grandmother 30   Hypertension Maternal Great-grandmother    Bladder Cancer Maternal Great-grandmother    Breast cancer Other    Outpatient Medications Prior to Visit  Medication Sig Dispense Refill   albuterol (VENTOLIN HFA) 108 (90 Base) MCG/ACT inhaler TAKE 2 PUFFS BY MOUTH EVERY 6  HOURS AS NEEDED FOR WHEEZE OR SHORTNESS OF BREATH 6.7 each 2   BuPROPion HBr (APLENZIN) 348 MG TB24 Take 1 tablet by mouth daily.     Cetirizine HCl (ZYRTEC PO) Take 10 mg by mouth daily.      cloNIDine (CATAPRES) 0.1 MG tablet Take 0.1 mg by mouth daily.     doxepin (SINEQUAN) 100 MG capsule Take 1 capsule (100 mg total) by mouth at bedtime. 90 capsule 3   etonogestrel (NEXPLANON) 68 MG IMPL implant 1 each by Subdermal route once.     fluticasone (FLOVENT HFA) 110 MCG/ACT inhaler Inhale 1 puff into the lungs in the morning and at  bedtime. 1 each 5   Galcanezumab-gnlm (EMGALITY) 120 MG/ML SOAJ Inject 120 mg into the skin every 28 (twenty-eight) days. 1.12 mL 5   rizatriptan (MAXALT) 10 MG tablet Take 10 mg by mouth as needed for migraine. May repeat in 2 hours if needed     LORazepam (ATIVAN) 0.5 MG tablet Take 0.5 mg by mouth continuous as needed for anxiety.     gabapentin (NEURONTIN) 100 MG capsule Take 3 capsules (300 mg total) by mouth 2 (two) times daily. 180 capsule 1   gadopentetate dimeglumine (MAGNEVIST) injection 18 mL      No facility-administered medications prior to visit.   Allergies  Allergen Reactions   Hydrocodone Swelling   Topamax [Topiramate]     Cognitive slowing    Objective:   Today's Vitals   05/30/21 1010  BP: 122/84  Pulse: 84  Temp: 98.2 F (36.8 C)  TempSrc: Temporal  SpO2: 99%  Weight: 223 lb (101.2 kg)  Height: 5' 7"  (1.702 m)   Body mass index is 34.93 kg/m.   General: Well developed, well nourished. No acute distress. Psych: Alert and oriented. Normal mood and affect.  Health Maintenance Due  Topic Date Due   INFLUENZA VACCINE  03/28/2021      Assessment & Plan:   1. Depression with anxiety Bethany Robinson will continue to follow with Bethany Robinson to primarily manage her psych meds. I will refill her lorazepam today.  - LORazepam (ATIVAN) 0.5 MG tablet; Take 1 tablet (0.5 mg total) by mouth continuous as needed for anxiety.  Dispense: 5 tablet; Refill: 0  2. Common migraine with intractable migraine Stable on Emgality.  3. Muscle ache We did discuss the possibility that her symptoms represent fibromyalgia. I encouraged her to start engaging in regular exercise, as the only proven approach to improved functioning in patients with fibromyalgia. If her symptoms become progressive, we could consider a rheumatology referral for assessment.  4. Need for influenza vaccination  - Flu Vaccine QUAD 6+ mos PF IM (Fluarix Quad PF)  Bethany Salter, MD

## 2021-05-31 ENCOUNTER — Encounter: Payer: Self-pay | Admitting: Obstetrics & Gynecology

## 2021-05-31 ENCOUNTER — Ambulatory Visit: Payer: 59 | Admitting: Obstetrics & Gynecology

## 2021-05-31 VITALS — BP 104/70 | HR 93 | Resp 16 | Wt 223.0 lb

## 2021-05-31 DIAGNOSIS — L700 Acne vulgaris: Secondary | ICD-10-CM | POA: Diagnosis not present

## 2021-05-31 DIAGNOSIS — Z975 Presence of (intrauterine) contraceptive device: Secondary | ICD-10-CM | POA: Diagnosis not present

## 2021-05-31 DIAGNOSIS — F3281 Premenstrual dysphoric disorder: Secondary | ICD-10-CM | POA: Diagnosis not present

## 2021-05-31 DIAGNOSIS — R4586 Emotional lability: Secondary | ICD-10-CM

## 2021-05-31 MED ORDER — DROSPIRENONE-ETHINYL ESTRADIOL 3-0.02 MG PO TABS: 1.0000 | ORAL_TABLET | Freq: Every day | ORAL | 2 refills | Status: DC

## 2021-05-31 NOTE — Progress Notes (Signed)
    Bethany Robinson 05/21/1991 [id number]        30 y.o.  G0   RP: Mood swings and acne on Nexplanon  HPI: On Nexplanon x 01/2019 with light menses.  Mood swings with low moods about every 3 weeks.  No suicidal ideations.  On Doxepin 100 mg HS for Depression and in psychotherapy.  Acne worsening during the low mood periods.  Difficulty loosing weight, but successfully lost 9 Lbs x 06/2020.  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   OB History  Gravida Para Term Preterm AB Living  0 0 0 0 0 0  SAB IAB Ectopic Multiple Live Births  0 0 0 0 0    Past medical history,surgical history, problem list, medications, allergies, family history and social history were all reviewed and documented in the EPIC chart.   Directed ROS with pertinent positives and negatives documented in the history of present illness/assessment and plan.  Exam:  Vitals:   05/31/21 1130  BP: 104/70  Pulse: 93  Resp: 16  Weight: 223 lb (101.2 kg)   General appearance:  Normal  Gynecologic exam: Deferred   Assessment/Plan:  30 y.o. G0P0000   1. Mood swings Probably part of PMDD which the Nexplanon is not controlling completely.  Will add Yaz to better block ovulation.  2. PMDD (premenstrual dysphoric disorder) PMDD improved on Nexplanon, but not completely controlled.  3. Acne vulgaris Will treat with Yaz  4. Nexplanon in place On Nexplanon x 01/2019.  Will change Nexplanon in 01/2022.  Other orders - drospirenone-ethinyl estradiol (YAZ) 3-0.02 MG tablet; Take 1 tablet by mouth daily.   Genia Del MD, 11:55 AM 05/31/2021

## 2021-06-03 ENCOUNTER — Encounter: Payer: Self-pay | Admitting: Obstetrics & Gynecology

## 2021-08-11 ENCOUNTER — Telehealth: Payer: Self-pay

## 2021-08-11 NOTE — Telephone Encounter (Signed)
New message  Your information has been sent to [facility].  Bethany Robinson (Key: [id number]) Emgality 120MG /ML auto-injectors (migraine)   Form [facility] Electronic Prior Authorization Form (2017 NCPDP) Created 9 days ago Sent to Plan 6 minutes ago Plan Response 6 minutes ago Submit Clinical Questions less than a minute ago Determination Wait for Determination Please wait for [facility] 2017 NCPDP to return a determination.

## 2021-08-30 ENCOUNTER — Other Ambulatory Visit: Payer: Self-pay

## 2021-08-30 ENCOUNTER — Ambulatory Visit: Payer: 59 | Admitting: Family Medicine

## 2021-08-30 ENCOUNTER — Encounter: Payer: Self-pay | Admitting: Family Medicine

## 2021-08-30 VITALS — BP 118/84 | HR 95 | Temp 97.4°F | Ht 67.0 in | Wt 219.2 lb

## 2021-08-30 DIAGNOSIS — N62 Hypertrophy of breast: Secondary | ICD-10-CM | POA: Diagnosis not present

## 2021-08-30 DIAGNOSIS — Z1501 Genetic susceptibility to malignant neoplasm of breast: Secondary | ICD-10-CM | POA: Diagnosis not present

## 2021-08-30 DIAGNOSIS — Z1509 Genetic susceptibility to other malignant neoplasm: Secondary | ICD-10-CM

## 2021-08-30 DIAGNOSIS — F418 Other specified anxiety disorders: Secondary | ICD-10-CM

## 2021-08-30 DIAGNOSIS — J45990 Exercise induced bronchospasm: Secondary | ICD-10-CM | POA: Diagnosis not present

## 2021-08-30 NOTE — Progress Notes (Signed)
[facility] [address] [place] [address] Dept: [phone] Dept Fax: [phone]  Chronic Care Office Visit  Subjective:    Patient ID: Bethany Robinson, female    DOB: [birth year], 31 y.o..   MRN: [id number]  Chief Complaint  Patient presents with   Follow-up    3 month f/u depression/meds.  No concerns.       History of Present Illness:  Patient is in today for reassessment of chronic medical issues.  Ms. Shoun has a history of depression with anxiety. She is currently seeing [facility] by Deveron Furlong (Psych NP) and has a follow-up in the next few weeks. She is currently managed on bupropion, clonidine, and doxepin. She also takes PRN Ativan for acute anxiety issues. She had a recent flare of her depression, but feels she is dealing with this. She has had past suicidality, but notes she is not having issue with this currently.   Ms. Montenegro has a history of exercise-induced asthma. She had COVID since I last saw her. She also notes her asthma flares more in the cold weather. She is only intermittently using her albuterol.   [name] has a family history of breast cancer and has tested positive for the BRCA2 gene. She is currently managed with alternating mammograms and breast MRIs 2 times a year. She is due for her MRI scan. Ms. Kelemen also has had some issues with large breast that cause some chronic upper back pain. She notes her mother had similar issues. Her mother had a breast reduction at one point and then later underwent mastectomy related to her risk for breast cancer. Ms. Battenfield is unsure if she should proceed with a mastectomy vs. a staged approach of breast reduction now and mastectomy later.  Past Medical History: Patient Active Problem List   Diagnosis Date Noted   Exercise-induced asthma 05/30/2021   Depression with anxiety 05/30/2021   Seasonal allergic rhinitis 05/30/2021    Muscle ache 05/30/2021   BRCA2 gene mutation positive 08/09/2018   Family history of breast cancer    Family history of BRCA2 gene positive    Common migraine with intractable migraine 09/27/2017   Past Surgical History:  Procedure Laterality Date   nexplanon     inserted 02-18-19   TONSILLECTOMY     Family History  Problem Relation Age of Onset   BRCA 1/2 Mother        BRCA2 pos   Migraines Father    BRCA 1/2 Maternal Aunt        BRCA2 pos   Diabetes Paternal Uncle    Breast cancer Maternal Grandmother    Diabetes Maternal Grandfather    Depression Paternal Grandmother    Heart failure Paternal Grandmother    Crohn's disease Paternal Grandmother    CVA Paternal Grandmother    Other Paternal Grandmother        CREST    Sjogren's syndrome Paternal Grandmother    Sjogren's syndrome Paternal Great-grandmother    Heart attack Maternal Great-grandmother 31   Hypertension Maternal Great-grandmother    Bladder Cancer Maternal Great-grandmother    Breast cancer Other    Outpatient Medications Prior to Visit  Medication Sig Dispense Refill   albuterol (VENTOLIN HFA) 108 (90 Base) MCG/ACT inhaler TAKE 2 PUFFS BY MOUTH EVERY 6 HOURS AS NEEDED FOR WHEEZE OR SHORTNESS OF BREATH 6.7 each 2   BuPROPion HBr (APLENZIN) 348 MG TB24 Take 1 tablet by mouth daily.  Cetirizine HCl (ZYRTEC PO) Take 10 mg by mouth daily.      cloNIDine (CATAPRES) 0.1 MG tablet Take 0.1 mg by mouth daily.     doxepin (SINEQUAN) 100 MG capsule Take 1 capsule (100 mg total) by mouth at bedtime. 90 capsule 3   drospirenone-ethinyl estradiol (YAZ) 3-0.02 MG tablet Take 1 tablet by mouth daily. 84 tablet 2   etonogestrel (NEXPLANON) 68 MG IMPL implant 1 each by Subdermal route once.     fluticasone (FLOVENT HFA) 110 MCG/ACT inhaler Inhale 1 puff into the lungs in the morning and at bedtime. 1 each 5   Galcanezumab-gnlm (EMGALITY) 120 MG/ML SOAJ Inject 120 mg into the skin every 28 (twenty-eight) days. 1.12 mL 5    LORazepam (ATIVAN) 0.5 MG tablet Take 1 tablet (0.5 mg total) by mouth continuous as needed for anxiety. 5 tablet 0   rizatriptan (MAXALT) 10 MG tablet Take 10 mg by mouth as needed for migraine. May repeat in 2 hours if needed     No facility-administered medications prior to visit.   Allergies  Allergen Reactions   Hydrocodone Swelling   Topamax [Topiramate]     Cognitive slowing    Objective:   Today's Vitals   08/30/21 0931  BP: 118/84  Pulse: 95  Temp: (!) 97.4 F (36.3 C)  TempSrc: Temporal  SpO2: 97%  Weight: 219 lb 3.2 oz (99.4 kg)  Height: 5' 7"  (1.702 m)   Body mass index is 34.33 kg/m.   General: Well developed, well nourished. No acute distress. Lungs: Clear to auscultation bilaterally. No wheezing, rales or rhonchi. Psych: Alert and oriented. Normal mood and affect.  Health Maintenance Due  Topic Date Due   Pneumococcal Vaccine 56-63 Years old (1 - PCV) Never done   COVID-19 Vaccine (4 - Booster for Moderna series) 10/05/2020    Imaging: MR Breast Bilateral (08/18/2020) IMPRESSION: 1. Indeterminate non-mass enhancement spanning 1.4 cm involving the LOWER INNER QUADRANT of the LEFT breast at POSTERIOR depth, possibly associated with architectural distortion. 2. No MRI evidence of malignancy involving the RIGHT breast. 3. No pathologic lymphadenopathy.   RECOMMENDATION: MRI guided biopsy of the non-mass enhancement associated with possible distortion in the LEFT breast.   BI-RADS CATEGORY  4: Suspicious.  MR Left Breast (09/10/2020) IMPRESSION: Normal examination. The recently demonstrated 1.4 cm area of non mass enhancement with possible architectural distortion was normal fibroglandular tissue. This does not need further evaluation.   RECOMMENDATION: Bilateral screening mammogram in 6 months per the patient's stated schedule for screening examinations.   BI-RADS CATEGORY  1: Negative.  Mammogram (03/22/2021) RECOMMENDATION: Given strong family  history and BRCA2 mutation annual high risk breast MRI is recommended (due December 2022), with annual screening mammography recommended beginning at age 18.   BI-RADS CATEGORY  1: Negative.  Assessment & Plan:   1. Depression with anxiety Although her depression is flared, Ms. Shor feels she is getting a handle on this. She will continue her antidepressants and counseling.  2. BRCA2 gene mutation positive Due for annual MRI of breast for screening. I will refer her to discuss potential mastectomy vs. breast reduction with delayed mastectomy until her 74s.  - MR BREAST BILATERAL W WO CONTRAST INC CAD; Future - Ambulatory referral to General Surgery  3. Macromastia As above.  - Ambulatory referral to General Surgery  4. Exercise-induced asthma Stable. Continue albuterol as needed.   Haydee Salter, MD

## 2021-09-04 NOTE — Progress Notes (Signed)
NEUROLOGY FOLLOW UP OFFICE NOTE  Lilliam Chamblee [id number]  Assessment/Plan:   1. Migraine without aura,without status migrainosus, not intractable 2.  Migraine with aura, without status migrainosus, not intractable 3.  Primary stabbing headache   1.  Headache prevention:  Will provide her with Emgality sample while this gets sorted out.  Advised that she may need to find another pharmacy that would be more reliable having it ready for her when she needs to take her next dose. 2.  Headache rescue:  Ibuprofen or rizatriptan 20m 3.  Limit use of pain relievers to no more than 2 days out of week to prevent risk of rebound or medication-overuse headache. 4.  Keep headache diary 5.  Follow up one year  Subjective:  ABliss Behnkeis a 31year old right-handed female with anxiety who follows up for migraines and pain in fingertips.   UPDATE: She has had trouble picking up her Emgality for the last 2 months.  There has been a delay in her pharmacy receiving it in stock.  She was last supposed to pick it up on 12/27.  She still hasn't gotten it.  Her pharmacy doesn't know when it will come in.  Headaches and dizzy spells have been worse.  Prior to this, they have continued to be very well controlled. Not last 27/ Intensity:  6/10 Duration:  2 hours Frequency: 2 a month Rescue therapy:  Ibuprofen, rarely rizatriptan Current NSAIDS:  Aleve, ibuprofen Current analgesics:  Excedrin Current triptans:  Rizatriptan 158mCurrent ergotamine:  none Current anti-emetic:  none Current muscle relaxants:  none Current anti-anxiolytic:  none Current sleep aide:  trazodone Current Antihypertensive medications:  clonidine Current Antidepressant medications:  bupropion 30073mdoxepin; trazodone Current Anticonvulsant medications:  none Current anti-CGRP:  Emgality Current Vitamins/Herbal/Supplements:  D, B12 (sometimes) Current Antihistamines/Decongestants:  Zyrtec Other therapy:   none Hormone/birth control:  Nexplanon   When she was having an MRI last month, she had numbness in her hands, involving the first 3 digits of both hands.  Afterwards it resolved after she finished the study.  It has not recurred.    Caffeine:  1-2 cups of coffee or 1 soda 3 to 4 days a week Alcohol:  Socially (2 to 3 drinks a week) Smoker:  no Diet:  Needs to increase water intake Exercise:  no Depression:  yes; Anxiety:  yes Other pain:  general Sleep hygiene:  suboptimal   HISTORY:  She has history of various neurologic symptoms since 2018, such as dizziness, numbness and tingling of hands feet, and legs, word-finding difficulty/stuttering and fatigue.  She has tremors in hand and has trouble with dexterity and fine-finger movements.  Symptoms are intermittent.  She had workup performed by a previous neurologist, which was negative.  MRI of brain with and without contrast from 10/02/17 was normal.  Symptoms subsequently resolved.  On 04/13/19, she presented to the ED for full-body twitching and muscle spasms that started after ingesting marijuana-laced gummy bear, which she has eaten in the past without complication.  She was treated with IVF and Ativan.  Following this event, she started to experience her previous symptoms, such as fatigue, paresthesias and word-finding difficulty.  B12 316; TSH 2.380.  Headaches have gotten worse as well.   She has several headaches: She reports ice-pick headaches:  Usually right-temple but may vary in location.  They are severe stabbing pain.  However she has a constant non-throbbing pain in various locations (right sided, behind head or behind eye).  Her typical migraines, since middle school, are a severe right occipital throbbing headache associated photophobia, phonophobia, sometimes nausea but no vomiting, visual disturbance or unilateral numbness and weakness.  They typically last 2 hours and occur maybe 2 to 3 times month.  No specific triggers.  They  are relieved with analgesics and sleep.   In early 2021, she started experiencing pain in all of her fingertips, described as a "deep bruising" pain, not a burning, stinging, or electric pain.  It often is aggravated when applying any degree of pressure on her fingertips.  No associated numbness or tingling.  She reports difficulty holding objects in her hands.  Sometimes her fingers turn white.  She has tension in her shoulders but no neck pain..  Symptoms somewhat improved after getting a neck adjustment by a chiropractor but have not resolved.  She notes some mild tingling along the outside of her ankles and feet.  Rheumatologic workup from January, including ANA, ds-DNA, Scl-70, sed rate 5, and CRP 3, were unremarkable.  Labs from August 2020 included TSH 2.380.  Labs included B12 576, vitamin D 19.8 (started supplement).  She did not tolerate Cymbalta.  I did not have an explanation for her symptoms.   Past NSAIDS:  Naproxen 563m Past analgesics:  Tylenol Past abortive triptans:  Sumatriptan 566mPast abortive ergotamine:  none Past muscle relaxants:  none Past anti-emetic:  none Past antihypertensive medications:  none Past antidepressant medications:  venlafaxine XR 751mCymbalta 36m30mst anticonvulsant medications:  topiramate 75mg50mmewhat helpful but cognitive deficits), gabapentin 100mg 31mM and 200mg i46m Past anti-CGRP:  none Past vitamins/Herbal/Supplements:  none Past antihistamines/decongestants:  none Other past therapies:  none     Family history of headache:  Father (migraine with aura such as right sided body numbness) Other family history:  Paternal grandmother (CREST syndrome)  PAST MEDICAL HISTORY: Past Medical History:  Diagnosis Date   Anxiety    Common migraine with intractable migraine 09/27/2017   Depression    Family history of BRCA2 gene positive    Family history of breast cancer     MEDICATIONS: Current Outpatient Medications on File Prior to  Visit  Medication Sig Dispense Refill   albuterol (VENTOLIN HFA) 108 (90 Base) MCG/ACT inhaler TAKE 2 PUFFS BY MOUTH EVERY 6 HOURS AS NEEDED FOR WHEEZE OR SHORTNESS OF BREATH 6.7 each 2   BuPROPion HBr (APLENZIN) 348 MG TB24 Take 1 tablet by mouth daily.     Cetirizine HCl (ZYRTEC PO) Take 10 mg by mouth daily.      cloNIDine (CATAPRES) 0.1 MG tablet Take 0.1 mg by mouth daily.     doxepin (SINEQUAN) 100 MG capsule Take 1 capsule (100 mg total) by mouth at bedtime. 90 capsule 3   drospirenone-ethinyl estradiol (YAZ) 3-0.02 MG tablet Take 1 tablet by mouth daily. 84 tablet 2   etonogestrel (NEXPLANON) 68 MG IMPL implant 1 each by Subdermal route once.     fluticasone (FLOVENT HFA) 110 MCG/ACT inhaler Inhale 1 puff into the lungs in the morning and at bedtime. 1 each 5   Galcanezumab-gnlm (EMGALITY) 120 MG/ML SOAJ Inject 120 mg into the skin every 28 (twenty-eight) days. 1.12 mL 5   LORazepam (ATIVAN) 0.5 MG tablet Take 1 tablet (0.5 mg total) by mouth continuous as needed for anxiety. 5 tablet 0   rizatriptan (MAXALT) 10 MG tablet Take 10 mg by mouth as needed for migraine. May repeat in 2 hours if needed     No  current facility-administered medications on file prior to visit.    ALLERGIES: Allergies  Allergen Reactions   Hydrocodone Swelling   Topamax [Topiramate]     Cognitive slowing    FAMILY HISTORY: Family History  Problem Relation Age of Onset   BRCA 1/2 Mother        BRCA2 pos   Migraines Father    BRCA 1/2 Maternal Aunt        BRCA2 pos   Diabetes Paternal Uncle    Breast cancer Maternal Grandmother    Diabetes Maternal Grandfather    Depression Paternal Grandmother    Heart failure Paternal Grandmother    Crohn's disease Paternal Grandmother    CVA Paternal Grandmother    Other Paternal Grandmother        CREST    Sjogren's syndrome Paternal Grandmother    Sjogren's syndrome Paternal Great-grandmother    Heart attack Maternal Great-grandmother 35   Hypertension  Maternal Great-grandmother    Bladder Cancer Maternal Great-grandmother    Breast cancer Other       Objective:  Blood pressure (!) 131/93, pulse (!) 101, resp. rate 20, height 5' 6"  (1.676 m), weight 219 lb (99.3 kg), SpO2 95 %. General: No acute distress.  Patient appears well-groomed.   Head:  Normocephalic/atraumatic Eyes:  Fundi examined but not visualized Neck: supple, no paraspinal tenderness, full range of motion Heart:  Regular rate and rhythm Lungs:  Clear to auscultation bilaterally Back: No paraspinal tenderness Neurological Exam: alert and oriented to person, place, and time.  Speech fluent and not dysarthric, language intact.  CN II-XII intact. Bulk and tone normal, muscle strength 5/5 throughout.  Sensation to light touch intact.  Deep tendon reflexes 2+ throughout, toes downgoing.  Finger to nose testing intact.  Gait normal, Romberg negative.   Metta Clines, DO  CC: Arlester Marker, MD

## 2021-09-05 ENCOUNTER — Other Ambulatory Visit: Payer: Self-pay

## 2021-09-05 ENCOUNTER — Ambulatory Visit: Payer: 59 | Admitting: Neurology

## 2021-09-05 ENCOUNTER — Encounter: Payer: Self-pay | Admitting: Neurology

## 2021-09-05 VITALS — BP 131/93 | HR 101 | Resp 20 | Ht 66.0 in | Wt 219.0 lb

## 2021-09-05 DIAGNOSIS — G43109 Migraine with aura, not intractable, without status migrainosus: Secondary | ICD-10-CM | POA: Diagnosis not present

## 2021-09-05 DIAGNOSIS — G4485 Primary stabbing headache: Secondary | ICD-10-CM

## 2021-09-05 NOTE — Patient Instructions (Signed)
Find out from your pharmacy when the [facility] will come in and whether this will be an ongoing problem.  If so, then contact another pharmacy to find out if they are having problems filling it.

## 2021-09-16 ENCOUNTER — Ambulatory Visit
Admission: RE | Admit: 2021-09-16 | Discharge: 2021-09-16 | Disposition: A | Discharge status: Home / Self Care | Payer: 59 | Source: Ambulatory Visit | Attending: Family Medicine | Admitting: Family Medicine

## 2021-09-16 ENCOUNTER — Other Ambulatory Visit: Payer: Self-pay

## 2021-09-16 DIAGNOSIS — Z1509 Genetic susceptibility to other malignant neoplasm: Secondary | ICD-10-CM

## 2021-09-16 DIAGNOSIS — Z1501 Genetic susceptibility to malignant neoplasm of breast: Secondary | ICD-10-CM

## 2021-09-16 MED ORDER — GADOBUTROL 1 MMOL/ML IV SOLN
10.0000 mL | Freq: Once | INTRAVENOUS | Status: AC | PRN
  Administered 2021-09-16: 10 mL via INTRAVENOUS

## 2021-10-10 ENCOUNTER — Ambulatory Visit: Payer: 59 | Admitting: Nurse Practitioner

## 2021-10-10 ENCOUNTER — Other Ambulatory Visit: Payer: Self-pay

## 2021-10-10 VITALS — BP 134/86 | HR 110 | Temp 98.7°F | Ht 66.0 in | Wt 220.2 lb

## 2021-10-10 DIAGNOSIS — G43709 Chronic migraine without aura, not intractable, without status migrainosus: Secondary | ICD-10-CM | POA: Diagnosis not present

## 2021-10-10 MED ORDER — KETOROLAC TROMETHAMINE 60 MG/2ML IM SOLN: 60.0000 mg | Freq: Once | INTRAMUSCULAR | Status: AC

## 2021-10-10 MED ORDER — PROMETHAZINE HCL 25 MG PO TABS
25.0000 mg | ORAL_TABLET | Freq: Three times a day (TID) | ORAL | 0 refills | Status: DC | PRN
Start: 2021-10-10 — End: 2022-05-30

## 2021-10-10 NOTE — Progress Notes (Signed)
Subjective:  Patient ID: Bethany Robinson, female    DOB: 1990-10-12  Age: 31 y.o. MRN: [id number]  CC: Acute Visit (Pt c/o headache that started this morning and seems to be getting worse. Pt states she has also been nauseated )  HPI  Common migraine with intractable migraine Migraine this morning: dizziness and nausea, right eye pressure Denies any new symptoms (no change in vision, no focal weakness, no numbness/tingling) Reports increase in migraine frequency: in last 30days. Current use of emgality injections, last dose 2wks ago. She also under the care of neurology Previous use of gabapentin, and topamax (no improvement) Current use maxalt and ibuprofen with significant relief.  Administer toradol injection today Advise to take maxalt when she gets home, and schedule f/up appt with neurology.  Reviewed past Medical, Social and Family history today.  Outpatient Medications Prior to Visit  Medication Sig Dispense Refill   albuterol (VENTOLIN HFA) 108 (90 Base) MCG/ACT inhaler TAKE 2 PUFFS BY MOUTH EVERY 6 HOURS AS NEEDED FOR WHEEZE OR SHORTNESS OF BREATH 6.7 each 2   BuPROPion HBr (APLENZIN) 348 MG TB24 Take 1 tablet by mouth daily.     Cetirizine HCl (ZYRTEC PO) Take 10 mg by mouth daily.      cloNIDine (CATAPRES) 0.1 MG tablet Take 0.1 mg by mouth daily.     doxepin (SINEQUAN) 100 MG capsule Take 1 capsule (100 mg total) by mouth at bedtime. 90 capsule 3   drospirenone-ethinyl estradiol (YAZ) 3-0.02 MG tablet Take 1 tablet by mouth daily. 84 tablet 2   etonogestrel (NEXPLANON) 68 MG IMPL implant 1 each by Subdermal route once.     fluticasone (FLOVENT HFA) 110 MCG/ACT inhaler Inhale 1 puff into the lungs in the morning and at bedtime. 1 each 5   Galcanezumab-gnlm (EMGALITY) 120 MG/ML SOAJ Inject 120 mg into the skin every 28 (twenty-eight) days. (Patient not taking: Reported on 09/05/2021) 1.12 mL 5   LORazepam (ATIVAN) 0.5 MG tablet Take 1 tablet (0.5 mg total) by  mouth continuous as needed for anxiety. 5 tablet 0   rizatriptan (MAXALT) 10 MG tablet Take 10 mg by mouth as needed for migraine. May repeat in 2 hours if needed     No facility-administered medications prior to visit.   ROS See HPI  Objective:  BP 134/86 (BP Location: Left Arm, Patient Position: Sitting, Cuff Size: Large)    Pulse (!) 110    Temp 98.7 F (37.1 C) (Esophageal)    Ht 5\' 6"  (1.676 m)    Wt 220 lb 3.2 oz (99.9 kg)    SpO2 98%    BMI 35.54 kg/m   Physical Exam Constitutional:      General: She is not in acute distress. Eyes:     Extraocular Movements: Extraocular movements intact.     Conjunctiva/sclera: Conjunctivae normal.     Pupils: Pupils are equal, round, and reactive to light.  Cardiovascular:     Rate and Rhythm: Normal rate.     Pulses: Normal pulses.  Pulmonary:     Effort: Pulmonary effort is normal.  Musculoskeletal:     Cervical back: Normal range of motion and neck supple.  Neurological:     Mental Status: She is alert and oriented to person, place, and time.     Cranial Nerves: No cranial nerve deficit.  Psychiatric:        Mood and Affect: Mood normal.        Behavior: Behavior normal.  Thought Content: Thought content normal.   Assessment & Plan:  This visit occurred during the SARS-CoV-2 public health emergency.  Safety protocols were in place, including screening questions prior to the visit, additional usage of staff PPE, and extensive cleaning of exam room while observing appropriate contact time as indicated for disinfecting solutions.   Alianah was seen today for acute visit.  Diagnoses and all orders for this visit:  Chronic migraine without aura without status migrainosus, not intractable -     ketorolac (TORADOL) injection 60 mg -     promethazine (PHENERGAN) 25 MG tablet; Take 1 tablet (25 mg total) by mouth every 8 (eight) hours as needed for nausea or vomiting.   Problem List Items Addressed This Visit        Cardiovascular and Mediastinum   Common migraine with intractable migraine - Primary    Migraine this morning: dizziness and nausea, right eye pressure Denies any new symptoms (no change in vision, no focal weakness, no numbness/tingling) Reports increase in migraine frequency: in last 30days. Current use of emgality injections, last dose 2wks ago. She also under the care of neurology Previous use of gabapentin, and topamax (no improvement) Current use maxalt and ibuprofen with significant relief.  Administer toradol injection today Advise to take maxalt when she gets home, and schedule f/up appt with neurology.      Relevant Medications   ketorolac (TORADOL) injection 60 mg   promethazine (PHENERGAN) 25 MG tablet    Follow-up: Return if symptoms worsen or fail to improve.  Alysia Penna, NP

## 2021-10-10 NOTE — Patient Instructions (Signed)
Take maxalt as prescribed Schedule f/up appt with Dr. Adriana Mccallum.  Migraine Headache A migraine headache is a very strong throbbing pain on one side or both sides of your head. This type of headache can also cause other symptoms. It can last from 4 hours to 3 days. Talk with your doctor about what things may bring on (trigger) this condition. What are the causes? The exact cause of this condition is not known. This condition may be triggered or caused by: Drinking alcohol. Smoking. Taking medicines, such as: Medicine used to treat chest pain (nitroglycerin). Birth control pills. Estrogen. Some blood pressure medicines. Eating or drinking certain products. Doing physical activity. Other things that may trigger a migraine headache include: Having a menstrual period. Pregnancy. Hunger. Stress. Not getting enough sleep or getting too much sleep. Weather changes. Tiredness (fatigue). What increases the risk? Being 81-73 years old. Being female. Having a family history of migraine headaches. Being Caucasian. Having depression or anxiety. Being very overweight. What are the signs or symptoms? A throbbing pain. This pain may: Happen in any area of the head, such as on one side or both sides. Make it hard to do daily activities. Get worse with physical activity. Get worse around bright lights or loud noises. Other symptoms may include: Feeling sick to your stomach (nauseous). Vomiting. Dizziness. Being sensitive to bright lights, loud noises, or smells. Before you get a migraine headache, you may get warning signs (an aura). An aura may include: Seeing flashing lights or having blind spots. Seeing bright spots, halos, or zigzag lines. Having tunnel vision or blurred vision. Having numbness or a tingling feeling. Having trouble talking. Having weak muscles. Some people have symptoms after a migraine headache (postdromal phase), such as: Tiredness. Trouble thinking  (concentrating). How is this treated? Taking medicines that: Relieve pain. Relieve the feeling of being sick to your stomach. Prevent migraine headaches. Treatment may also include: Having acupuncture. Avoiding foods that bring on migraine headaches. Learning ways to control your body functions (biofeedback). Therapy to help you know and deal with negative thoughts (cognitive behavioral therapy). Follow these instructions at home: Medicines Take over-the-counter and prescription medicines only as told by your doctor. Ask your doctor if the medicine prescribed to you: Requires you to avoid driving or using heavy machinery. Can cause trouble pooping (constipation). You may need to take these steps to prevent or treat trouble pooping: Drink enough fluid to keep your pee (urine) pale yellow. Take over-the-counter or prescription medicines. Eat foods that are high in fiber. These include beans, whole grains, and fresh fruits and vegetables. Limit foods that are high in fat and sugar. These include fried or sweet foods. Lifestyle Do not drink alcohol. Do not use any products that contain nicotine or tobacco, such as cigarettes, e-cigarettes, and chewing tobacco. If you need help quitting, ask your doctor. Get at least 8 hours of sleep every night. Limit and deal with stress. General instructions   Keep a journal to find out what may bring on your migraine headaches. For example, write down: What you eat and drink. How much sleep you get. Any change in what you eat or drink. Any change in your medicines. If you have a migraine headache: Avoid things that make your symptoms worse, such as bright lights. It may help to lie down in a dark, quiet room. Do not drive or use heavy machinery. Ask your doctor what activities are safe for you. Keep all follow-up visits as told by your doctor. This is  important. Contact a doctor if: You get a migraine headache that is different or worse than  others you have had. You have more than 15 headache days in one month. Get help right away if: Your migraine headache gets very bad. Your migraine headache lasts longer than 72 hours. You have a fever. You have a stiff neck. You have trouble seeing. Your muscles feel weak or like you cannot control them. You start to lose your balance a lot. You start to have trouble walking. You pass out (faint). You have a seizure. Summary A migraine headache is a very strong throbbing pain on one side or both sides of your head. These headaches can also cause other symptoms. This condition may be treated with medicines and changes to your lifestyle. Keep a journal to find out what may bring on your migraine headaches. Contact a doctor if you get a migraine headache that is different or worse than others you have had. Contact your doctor if you have more than 15 headache days in a month. This information is not intended to replace advice given to you by your health care provider. Make sure you discuss any questions you have with your health care provider. Document Revised: 12/06/2018 Document Reviewed: 09/26/2018 Elsevier Patient Education  2022 [employer].

## 2021-10-11 ENCOUNTER — Encounter: Payer: Self-pay | Admitting: Nurse Practitioner

## 2021-10-11 NOTE — Assessment & Plan Note (Addendum)
Migraine this morning: dizziness and nausea, right eye pressure Denies any new symptoms (no change in vision, no focal weakness, no numbness/tingling) Reports increase in migraine frequency: in last 30days. Current use of emgality injections, last dose 2wks ago. She also under the care of neurology Previous use of gabapentin, and topamax (no improvement) Current use maxalt and ibuprofen with significant relief.  Administer toradol injection today Advise to take maxalt when she gets home, and schedule f/up appt with neurology.

## 2021-10-31 ENCOUNTER — Other Ambulatory Visit: Payer: Self-pay | Admitting: Neurology

## 2021-11-14 ENCOUNTER — Other Ambulatory Visit: Payer: Self-pay

## 2021-11-14 ENCOUNTER — Ambulatory Visit: Payer: 59 | Admitting: Family Medicine

## 2021-11-14 VITALS — BP 118/76 | HR 71 | Temp 97.6°F | Ht 66.0 in | Wt 222.2 lb

## 2021-11-14 DIAGNOSIS — H6502 Acute serous otitis media, left ear: Secondary | ICD-10-CM | POA: Diagnosis not present

## 2021-11-14 DIAGNOSIS — B354 Tinea corporis: Secondary | ICD-10-CM | POA: Diagnosis not present

## 2021-11-14 MED ORDER — FLUTICASONE PROPIONATE 50 MCG/ACT NA SUSP
2.0000 | Freq: Every day | NASAL | 6 refills | Status: AC
Start: 2021-11-14 — End: ?

## 2021-11-14 NOTE — Progress Notes (Signed)
?[facility] ?[facility] ?[place] ?[place] [address] ?Dept: [phone] ?Dept Fax: [phone] ? ?Office Visit ? ?Subjective:  ? ? Patient ID: Bethany Robinson, female    DOB: [birth year], 31 y.o..   MRN: [id number] ? ?Chief Complaint  ?Patient presents with  ? Acute Visit  ?  C/o having LT ear pain x 4 days.   ? ? ?History of Present Illness: ? ?Patient is in today for evaluation of her left ear. She notes that about 4-days ago, she was having decreased hearing with mild pain int he ear. Since that time, her ear has improved somewhat. She has a history of chronic allergic rhinitis. She is managed on Zyrtec daily.  ? ?Additionally, she has a rash in the right antecubital area for the past 2 weeks. This has not been pruritic. She does not have a history of eczema. ? ?Past Medical History: ?Patient Active Problem List  ? Diagnosis Date Noted  ? Exercise-induced asthma 05/30/2021  ? Depression with anxiety 05/30/2021  ? Seasonal allergic rhinitis 05/30/2021  ? Muscle ache 05/30/2021  ? BRCA2 gene mutation positive 08/09/2018  ? Family history of breast cancer   ? Family history of BRCA2 gene positive   ? Common migraine with intractable migraine 09/27/2017  ? ?Past Surgical History:  ?Procedure Laterality Date  ? nexplanon    ? inserted 02-18-19  ? TONSILLECTOMY    ? ?Family History  ?Problem Relation Age of Onset  ? BRCA 1/2 Mother   ?     BRCA2 pos  ? Migraines Father   ? BRCA 1/2 Maternal Aunt   ?     BRCA2 pos  ? Diabetes Paternal Uncle   ? Breast cancer Maternal Grandmother   ? Diabetes Maternal Grandfather   ? Depression Paternal Grandmother   ? Heart failure Paternal Grandmother   ? Crohn's disease Paternal Grandmother   ? CVA Paternal Grandmother   ? Other Paternal Grandmother   ?     CREST   ? Sjogren's syndrome Paternal Grandmother   ? Sjogren's syndrome Paternal Great-grandmother   ? Heart attack Maternal Great-grandmother 8  ? Hypertension Maternal  Great-grandmother   ? Bladder Cancer Maternal Great-grandmother   ? Breast cancer Other   ? ?Outpatient Medications Prior to Visit  ?Medication Sig Dispense Refill  ? albuterol (VENTOLIN HFA) 108 (90 Base) MCG/ACT inhaler TAKE 2 PUFFS BY MOUTH EVERY 6 HOURS AS NEEDED FOR WHEEZE OR SHORTNESS OF BREATH 6.7 each 2  ? BuPROPion HBr (APLENZIN) 348 MG TB24 Take 1 tablet by mouth daily.    ? Cetirizine HCl (ZYRTEC PO) Take 10 mg by mouth daily.     ? cloNIDine (CATAPRES) 0.1 MG tablet Take 0.1 mg by mouth daily.    ? doxepin (SINEQUAN) 100 MG capsule Take 1 capsule (100 mg total) by mouth at bedtime. 90 capsule 3  ? drospirenone-ethinyl estradiol (YAZ) 3-0.02 MG tablet Take 1 tablet by mouth daily. 84 tablet 2  ? EMGALITY 120 MG/ML SOAJ INJECT 120 MG INTO THE SKIN EVERY 28 (TWENTY-EIGHT) DAYS. 1 mL 5  ? etonogestrel (NEXPLANON) 68 MG IMPL implant 1 each by Subdermal route once.    ? fluticasone (FLOVENT HFA) 110 MCG/ACT inhaler Inhale 1 puff into the lungs in the morning and at bedtime. 1 each 5  ? LORazepam (ATIVAN) 0.5 MG tablet Take 1 tablet (0.5 mg total) by mouth continuous as needed for anxiety. 5 tablet 0  ? promethazine (PHENERGAN) 25 MG tablet Take  1 tablet (25 mg total) by mouth every 8 (eight) hours as needed for nausea or vomiting. 20 tablet 0  ? rizatriptan (MAXALT) 10 MG tablet Take 10 mg by mouth as needed for migraine. May repeat in 2 hours if needed    ? valACYclovir (VALTREX) 1000 MG tablet SMARTSIG:2 Tablet(s) By Mouth Morning-Night    ? ?No facility-administered medications prior to visit.  ? ?Allergies  ?Allergen Reactions  ? Hydrocodone Swelling  ? Topamax [Topiramate]   ?  Cognitive slowing  ?  ?Objective:  ? ?Today's Vitals  ? 11/14/21 0805  ?BP: 118/76  ?Pulse: 71  ?Temp: 97.6 ?F (36.4 ?C)  ?TempSrc: Temporal  ?SpO2: 98%  ?Weight: 222 lb 3.2 oz (100.8 kg)  ?Height: 5' 6"  (1.676 m)  ? ?Body mass index is 35.86 kg/m?.  ? ?General: Well developed, well nourished. No acute distress. ?HEENT:  Normocephalic, non-traumatic. Conjunctiva clear. External ears normal. The left TM shows  ? some mild bulging with fluid behind the drum. Nose with moderate congestion on the left. Mucous  ? membranes moist. Moderate mucous streaking of the posterior oropharynx clear. Good dentition. ?Neck: Supple. No lymphadenopathy. No thyromegaly. ?Skin: Warm and dry. There are two macular patches with a scaly border int he right antecubital fossa.  ? Mild redness noted. +/- central clearing. ?Psych: Alert and oriented. Normal mood and affect. ? ?There are no preventive care reminders to display for this patient. ?   ?Assessment & Plan:  ? ?1. Non-recurrent acute serous otitis media of left ear ?Likely having serous otitis secondary to chronic allergic rhinitis with more recent flare. Continue Zyrtec daily. I recommended she ad a nasal steroid, which has used before. She notes prior issues with burning in the nose. I discussed technique to lower the risk of that. Follow-up if not improving., ? ?- fluticasone (FLONASE) 50 MCG/ACT nasal spray; Place 2 sprays into both nostrils daily.  Dispense: 16 g; Refill: 6 ? ?2. Tinea corporis ?The rash appears to be a ringworm. I would also consider eczema in light of history of allergies and the location of the rash. I recommend she first try using an OTC antifungal cream. If not improving with after 2 weeks, she might consider trying a steroid cream. ? ?Return if symptoms worsen or fail to improve.  ? ?Haydee Salter, MD ?

## 2021-11-14 NOTE — Patient Instructions (Signed)
Recommend OTC antifungal cream (clotrimazole) applied to rash twice a day until resolved. ?If rash is not improving with antifungal cream, you might try OTC 1% hydrocortisone cream twice a day. ?

## 2021-11-28 ENCOUNTER — Ambulatory Visit: Payer: 59 | Admitting: Family Medicine

## 2021-11-28 VITALS — BP 126/82 | HR 91 | Temp 98.7°F | Ht 66.0 in | Wt 220.2 lb

## 2021-11-28 DIAGNOSIS — F418 Other specified anxiety disorders: Secondary | ICD-10-CM | POA: Diagnosis not present

## 2021-11-28 DIAGNOSIS — M25562 Pain in left knee: Secondary | ICD-10-CM | POA: Diagnosis not present

## 2021-11-28 DIAGNOSIS — I7300 Raynaud's syndrome without gangrene: Secondary | ICD-10-CM

## 2021-11-28 DIAGNOSIS — M791 Myalgia, unspecified site: Secondary | ICD-10-CM | POA: Diagnosis not present

## 2021-11-28 DIAGNOSIS — M25561 Pain in right knee: Secondary | ICD-10-CM | POA: Diagnosis not present

## 2021-11-28 LAB — C-REACTIVE PROTEIN: CRP: <1 mg/dL (ref 0.5–20.0)

## 2021-11-28 LAB — SEDIMENTATION RATE: Sed Rate: 16 mm/hr (ref 0–20)

## 2021-11-28 NOTE — Progress Notes (Signed)
?[facility] ?[facility] ?[place] ?[place] [address] ?Dept: [phone] ?Dept Fax: [phone] ? ?Chronic Care Office Visit ? ?Subjective:  ? ? Patient ID: Bethany Robinson, female    DOB: 21-Feb-1991, 31 y.o..   MRN: [id number] ? ?Chief Complaint  ?Patient presents with  ? Follow-up  ?  3 month f/u, c/o having bilateral knee pain.    ? ? ?History of Present Illness: ? ?Patient is in today for reassessment of chronic medical issues. ? ?Bethany Robinson has a history of depression with anxiety. She continues to be seen at  [facility] by Deveron Furlong (Psych NP) and has a follow-up in the next few weeks. She is currently managed on bupropion, clonidine, and doxepin. She also takes PRN Ativan for acute anxiety issues. She notes her mood fluctuates day to day. There has been thought that she may have a component of PMDD. Her having a Nexplanon makes it difficult to assess any cyclic nature, as she is not have menses consistently. ?  ?Bethany Robinson notes that she had a few episodes of noting her toes turn white. She also notes, that after standing for several minutes, she develops redness over her knees, swelling of the joint, and pain with walking. She had spoken with her [facility] provider, who had suggested she be evaluated for an autoimmune issue. She denies other joints that specifically give her pain, but does have  apast history of muscle achiness.  ? ?Past Medical History: ?Patient Active Problem List  ? Diagnosis Date Noted  ? Exercise-induced asthma 05/30/2021  ? Depression with anxiety 05/30/2021  ? Seasonal allergic rhinitis 05/30/2021  ? Muscle ache 05/30/2021  ? BRCA2 gene mutation positive 08/09/2018  ? Family history of breast cancer   ? Family history of BRCA2 gene positive   ? Common migraine with intractable migraine 09/27/2017  ? ?Past Surgical History:  ?Procedure Laterality Date  ? nexplanon    ? inserted 02-18-19  ? TONSILLECTOMY     ? ?Family History  ?Problem Relation Age of Onset  ? BRCA 1/2 Mother   ?     BRCA2 pos  ? Migraines Father   ? BRCA 1/2 Maternal Aunt   ?     BRCA2 pos  ? Diabetes Paternal Uncle   ? Breast cancer Maternal Grandmother   ? Diabetes Maternal Grandfather   ? Depression Paternal Grandmother   ? Heart failure Paternal Grandmother   ? Crohn's disease Paternal Grandmother   ? CVA Paternal Grandmother   ? Other Paternal Grandmother   ?     CREST   ? Sjogren's syndrome Paternal Grandmother   ? Sjogren's syndrome Paternal Great-grandmother   ? Heart attack Maternal Great-grandmother 96  ? Hypertension Maternal Great-grandmother   ? Bladder Cancer Maternal Great-grandmother   ? Breast cancer Other   ? ?Outpatient Medications Prior to Visit  ?Medication Sig Dispense Refill  ? albuterol (VENTOLIN HFA) 108 (90 Base) MCG/ACT inhaler TAKE 2 PUFFS BY MOUTH EVERY 6 HOURS AS NEEDED FOR WHEEZE OR SHORTNESS OF BREATH 6.7 each 2  ? BuPROPion HBr (APLENZIN) 348 MG TB24 Take 1 tablet by mouth daily.    ? Cetirizine HCl (ZYRTEC PO) Take 10 mg by mouth daily.     ? cloNIDine (CATAPRES) 0.1 MG tablet Take 0.1 mg by mouth daily.    ? doxepin (SINEQUAN) 100 MG capsule Take 1 capsule (100 mg total) by mouth at bedtime. 90 capsule 3  ? drospirenone-ethinyl estradiol (YAZ) 3-0.02  MG tablet Take 1 tablet by mouth daily. 84 tablet 2  ? EMGALITY 120 MG/ML SOAJ INJECT 120 MG INTO THE SKIN EVERY 28 (TWENTY-EIGHT) DAYS. 1 mL 5  ? etonogestrel (NEXPLANON) 68 MG IMPL implant 1 each by Subdermal route once.    ? fluticasone (FLONASE) 50 MCG/ACT nasal spray Place 2 sprays into both nostrils daily. 16 g 6  ? fluticasone (FLOVENT HFA) 110 MCG/ACT inhaler Inhale 1 puff into the lungs in the morning and at bedtime. 1 each 5  ? LORazepam (ATIVAN) 0.5 MG tablet Take 1 tablet (0.5 mg total) by mouth continuous as needed for anxiety. 5 tablet 0  ? promethazine (PHENERGAN) 25 MG tablet Take 1 tablet (25 mg total) by mouth every 8 (eight) hours as needed for nausea  or vomiting. 20 tablet 0  ? rizatriptan (MAXALT) 10 MG tablet Take 10 mg by mouth as needed for migraine. May repeat in 2 hours if needed    ? valACYclovir (VALTREX) 1000 MG tablet SMARTSIG:2 Tablet(s) By Mouth Morning-Night    ? ?No facility-administered medications prior to visit.  ? ?Allergies  ?Allergen Reactions  ? Hydrocodone Swelling  ? Topamax [Topiramate]   ?  Cognitive slowing  ?   ?Objective:  ? ?Today's Vitals  ? 11/28/21 1302  ?BP: 126/82  ?Pulse: 91  ?Temp: 98.7 ?F (37.1 ?C)  ?TempSrc: Temporal  ?SpO2: 99%  ?Weight: 220 lb 3.2 oz (99.9 kg)  ?Height: 5' 6" (1.676 m)  ? ?Body mass index is 35.54 kg/m?.  ? ?General: Well developed, well nourished. No acute distress. ?Extremities: Full ROM. Mild bogginess around the knee joints, but nontender. No crepitance. No  ? increased warmth. Joints of hands, wrists, elbows and ankles all appear normal. Bethany Robinson  ? showed me a photo of her feet, where there is a distinct whitening of some of the toes compared to  ? others. She showed me a 2nd photo of her knees, where there is obvous redness overluing the  ? anterior knee joint. ?Psych: Alert and oriented. Patient appears to be fighting back her emotions at times while she  ? discusses her issues. ? ?There are no preventive care reminders to display for this patient. ?   ?Assessment & Plan:  ? ?1. Depression with anxiety ?Bethany Robinson will continue to work with behavioral health. I am concerned that she may be only in partial remission related to her depression/anxiety. ? ?2. Muscle ache ?3. Acute pain of both knees ?4. Raynaud's phenomenon without gangrene ?Bethany Robinson's symptoms could be secondary to an autoimmune disease. I will start by checking a CRP and ESR to evaluate for inflammation, and an ANA, RF, and CCP to screen for more common rheumatic issues. base don results and how she does, she may need further evaluaiton or a rheumatology appointment to assess. ? ?- ANA ?- Sedimentation rate ?- C-reactive  protein ?- Rheumatoid factor ?- Cyclic citrul peptide antibody, IgG ([facility]) ? ?Return in about 6 months (around 05/30/2022) for Reassessment.  ? ?Haydee Salter, MD ?

## 2021-11-30 ENCOUNTER — Encounter: Payer: Self-pay | Admitting: Family Medicine

## 2021-11-30 LAB — CYCLIC CITRUL PEPTIDE ANTIBODY, IGG: Cyclic Citrullin Peptide Ab: <16 UNITS

## 2021-11-30 LAB — ANA: Anti Nuclear Antibody (ANA): NEGATIVE

## 2021-11-30 LAB — RHEUMATOID FACTOR: Rheumatoid fact SerPl-aCnc: <14 IU/mL (ref <14)

## 2022-01-16 ENCOUNTER — Other Ambulatory Visit: Payer: Self-pay

## 2022-01-16 MED ORDER — EMGALITY 120 MG/ML ~~LOC~~ SOAJ: 120.0000 mg | SUBCUTANEOUS | 5 refills | Status: DC

## 2022-01-16 NOTE — Telephone Encounter (Signed)
Prescription request received from [facility] for Emgaltiy.  Telephone call to patient, to makes sure [facility].  Per patient she will like it sent to [facility] it takes to long for [facility] to give her medication.   Script sent to [facility] per pateint.

## 2022-01-17 ENCOUNTER — Other Ambulatory Visit (HOSPITAL_COMMUNITY)
Admission: RE | Admit: 2022-01-17 | Discharge: 2022-01-17 | Disposition: A | Discharge status: Home / Self Care | Payer: 59 | Source: Ambulatory Visit | Attending: Obstetrics & Gynecology | Admitting: Obstetrics & Gynecology

## 2022-01-17 ENCOUNTER — Ambulatory Visit (INDEPENDENT_AMBULATORY_CARE_PROVIDER_SITE_OTHER): Payer: 59 | Admitting: Obstetrics & Gynecology

## 2022-01-17 ENCOUNTER — Encounter: Payer: Self-pay | Admitting: Obstetrics & Gynecology

## 2022-01-17 VITALS — BP 118/86 | Ht 66.0 in | Wt 217.0 lb

## 2022-01-17 DIAGNOSIS — Z3046 Encounter for surveillance of implantable subdermal contraceptive: Secondary | ICD-10-CM

## 2022-01-17 DIAGNOSIS — Z1501 Genetic susceptibility to malignant neoplasm of breast: Secondary | ICD-10-CM

## 2022-01-17 DIAGNOSIS — Z01419 Encounter for gynecological examination (general) (routine) without abnormal findings: Secondary | ICD-10-CM

## 2022-01-17 DIAGNOSIS — Z113 Encounter for screening for infections with a predominantly sexual mode of transmission: Secondary | ICD-10-CM | POA: Insufficient documentation

## 2022-01-17 DIAGNOSIS — Z1509 Genetic susceptibility to other malignant neoplasm: Secondary | ICD-10-CM

## 2022-01-17 NOTE — Progress Notes (Signed)
Bethany Robinson [birth year] [id number]   History:    31 y.o. G0  Single   RP:  Established patient presenting for annual gyn exam    HPI: Well on Nexplanon x 01/2019.  Spaced light menses.  No pelvic pain.  No pain with IC.  No vaginal d/c.  Would like STI screen. Pap 06/2020 Neg.  Pap reflex, Gono-Chlam today.  Breasts normal.  BrCa2 pos. Fam h/o Breast Ca and Ovarian Ca.  Alternating Mammo/MRI of Breasts every 6 months.  Planning breast reduction in 03/2022.  Planning eventual Bilateral Mastectomy. Urine/BMs normal. BMI 37.45.  Health labs with Fam MD.   Past medical history,surgical history, family history and social history were all reviewed and documented in the EPIC chart.  Gynecologic History No LMP recorded. Patient has had an implant.  Obstetric History OB History  Gravida Para Term Preterm AB Living  0 0 0 0 0 0  SAB IAB Ectopic Multiple Live Births  0 0 0 0 0     ROS: A ROS was performed and pertinent positives and negatives are included in the history.  GENERAL: No fevers or chills. HEENT: No change in vision, no earache, sore throat or sinus congestion. NECK: No pain or stiffness. CARDIOVASCULAR: No chest pain or pressure. No palpitations. PULMONARY: No shortness of breath, cough or wheeze. GASTROINTESTINAL: No abdominal pain, nausea, vomiting or diarrhea, melena or bright red blood per rectum. GENITOURINARY: No urinary frequency, urgency, hesitancy or dysuria. MUSCULOSKELETAL: No joint or muscle pain, no back pain, no recent trauma. DERMATOLOGIC: No rash, no itching, no lesions. ENDOCRINE: No polyuria, polydipsia, no heat or cold intolerance. No recent change in weight. HEMATOLOGICAL: No anemia or easy bruising or bleeding. NEUROLOGIC: No headache, seizures, numbness, tingling or weakness. PSYCHIATRIC: No depression, no loss of interest in normal activity or change in sleep pattern.     Exam:   BP 118/86 (BP Location: Right Arm, Patient Position: Sitting, Cuff Size:  Large)   Ht _0  (1.676 m)   Wt 217 lb (98.4 kg)   BMI 35.02 kg/m   Body mass index is 35.02 kg/m.  General appearance : Well developed well nourished female. No acute distress HEENT: Eyes: no retinal hemorrhage or exudates,  Neck supple, trachea midline, no carotid bruits, no thyroidmegaly Lungs: Clear to auscultation, no rhonchi or wheezes, or rib retractions  Heart: Regular rate and rhythm, no murmurs or gallops Breast:Examined in sitting and supine position were symmetrical in appearance, no palpable masses or tenderness,  no skin retraction, no nipple inversion, no nipple discharge, no skin discoloration, no axillary or supraclavicular lymphadenopathy Abdomen: no palpable masses or tenderness, no rebound or guarding Extremities: no edema or skin discoloration or tenderness  Pelvic: Vulva: Normal             Vagina: No gross lesions or discharge  Cervix: No gross lesions or discharge.  Pap, Gono-Chlam  Uterus  AV, normal size, shape and consistency, non-tender and mobile  Adnexa  Without masses or tenderness  Anus: Normal   Assessment/Plan:  31 y.o. female for annual exam   1. Encounter for routine gynecological examination with Papanicolaou smear of cervix Well on Nexplanon x 01/2019.  Spaced light menses.  No pelvic pain.  No pain with IC.  No vaginal d/c.  Would like STI screen. Pap 06/2020 Neg.  Pap reflex, Gono-Chlam today.  Breasts normal.  BrCa2 pos. Fam h/o Breast Ca and Ovarian Ca.  Alternating Mammo/MRI of Breasts every 6 months.  Planning  breast reduction in 03/2022.  Planning eventual Bilateral Mastectomy. Urine/BMs normal. BMI 37.45.  Health labs with Fam MD. - Cytology - PAP( [place])  2. Encounter for surveillance of implantable subdermal contraceptive Well on Nexplanon x 01/2019.  Spaced light menses.  No pelvic pain.  No pain with IC.  Will f/u 01/2022 to remove/insert a new Nexplanon.  3. BRCA2 positive Ca 125 today.  F/U Pelvic US for screening. - US  Transvaginal Non-OB; Future - CA 125  4. Screen for STD (sexually transmitted disease) Condoms - HIV antibody (with reflex) - RPR - Hepatitis B Surface AntiGEN - Hepatitis C Antibody - Cytology - PAP( [place])- Gono-Chlam  Other orders - amphetamine-dextroamphetamine (ADDERALL) 15 MG tablet; Take 0.5 tablets by mouth 2 (two) times daily. - buPROPion (WELLBUTRIN SR) 150 MG 12 hr tablet; Take 150 mg by mouth every morning. - doxepin (SINEQUAN) 25 MG capsule; Take 50 mg by mouth 2 (two) times daily.   Princess Bruins MD, 11:53 AM 01/17/2022

## 2022-01-18 LAB — RPR: RPR Ser Ql: NONREACTIVE

## 2022-01-18 LAB — CYTOLOGY - PAP
Chlamydia: NEGATIVE
Comment: NEGATIVE
Comment: NORMAL
Diagnosis: NEGATIVE
Neisseria Gonorrhea: NEGATIVE

## 2022-01-18 LAB — HIV ANTIBODY (ROUTINE TESTING W REFLEX): HIV 1&2 Ab, 4th Generation: NONREACTIVE

## 2022-01-18 LAB — HEPATITIS B SURFACE ANTIGEN: Hepatitis B Surface Ag: NONREACTIVE

## 2022-01-18 LAB — HEPATITIS C ANTIBODY
Hepatitis C Ab: NONREACTIVE
SIGNAL TO CUT-OFF: 0.1 (ref <1.00)

## 2022-01-18 LAB — CA 125: CA 125: 13 U/mL (ref <35)

## 2022-01-20 ENCOUNTER — Ambulatory Visit (INDEPENDENT_AMBULATORY_CARE_PROVIDER_SITE_OTHER): Payer: 59 | Admitting: Obstetrics & Gynecology

## 2022-01-20 ENCOUNTER — Encounter: Payer: Self-pay | Admitting: Obstetrics & Gynecology

## 2022-01-20 VITALS — BP 120/76

## 2022-01-20 DIAGNOSIS — Z1501 Genetic susceptibility to malignant neoplasm of breast: Secondary | ICD-10-CM

## 2022-01-20 DIAGNOSIS — Z1509 Genetic susceptibility to other malignant neoplasm: Secondary | ICD-10-CM | POA: Diagnosis not present

## 2022-01-20 DIAGNOSIS — Z113 Encounter for screening for infections with a predominantly sexual mode of transmission: Secondary | ICD-10-CM

## 2022-01-20 DIAGNOSIS — Z3046 Encounter for surveillance of implantable subdermal contraceptive: Secondary | ICD-10-CM | POA: Diagnosis not present

## 2022-01-20 NOTE — Progress Notes (Signed)
Bethany Robinson Aug 10, 1991 [id number]        31 y.o.  G0P0000   RP: Nexplanon removal and insertion of a new Nexplanon  HPI: Time to switch to a new Nexplanon after 3 yrs.  Well on Nexplanon with no pelvic pain, no BTB.  STI screen Negative 01/17/2022.  BrCa 2 Positive:  Ca 125 normal at 13.  Pelvic US scheduled.   OB History  Gravida Para Term Preterm AB Living  0 0 0 0 0 0  SAB IAB Ectopic Multiple Live Births  0 0 0 0 0    Past medical history,surgical history, problem list, medications, allergies, family history and social history were all reviewed and documented in the EPIC chart.   Directed ROS with pertinent positives and negatives documented in the history of present illness/assessment and plan.  Exam:  Vitals:   01/20/22 1129  BP: 120/76   General appearance:  Normal                                                             Nexplanon procedure note (removal)  The patient presented to the office today requesting for removal of her Nexplanon that was placed in the year 2020 on her Left arm.   On examination the nexplanon implant was palpated and the distal end  (end  closest to the elbow) was marked. The area was sterilized with Betadine solution. 1% lidocaine was used for local anesthesia and approximately 1 cc  was injected into the site that was marked where the incision was to be made. The local anesthetic was injected under the implant in an effort to keep it  close to the skin surface. Slight pressure pushing downward was made at the proximal end  of the implant in an effort to stabilize it. A bulge appeared indicating the distal end of the implant. A small transverse incision of 2 mm was made at that location. By gently pushing the implant toward the incision, the tip became visible. Grasping the implant with a curved forcep facilitated in gently removing the implant. Full confirmation of the entire implant which is 4 cm long was inspected and was intact and was  shown to the patient and discarded. After removing the implant, the incision was closed with 3Steri-Strips, a band-aid and a bandage. Patient will be instructed to remove the pressure bandage in 24 hours, the band-aid in 3 days and the Steri-Strips in 7 days.                                               Nexplanon Procedure Note (insertion)   The patient was laying on her back with her nondominant arm flexed at the elbow and externally rotated. The insertion site was identified as the underside of the nondominant upper arm approximately 8 cm from the medial epicondyle of the humerus.  A mark was made with a sterile marker at the spot where the Nexplanon implant will be inserted. The area was cleansed with Betadine solution. The area was anesthetized with 1% lidocaine  (1 cc)  at the area the injection site and underneath the skin along the planned insertion tunnel.  The preloaded disposable Nexplanon was removed from its sterile casing.  The applicator was held above the needle at the textured surface area. The transparent protector was removed. With a freehand, the skin was stretched around the insertion site with a thumb and index finger. The skin was then punctured with the tip of the needle angled at 30. The Nexplanon applicator was lowered to a horizontal position. While lifting the skin with the tip of the needle the needle was then slid to its full length. The applicator was kept in this position with the needle inserted to its full length. The purple slider was unlocked by pushing it slightly downward. The slider was fully moved back until it stopped. This allowed the implant to be in the final subdermal position and the needle to be locked inside the body of the applicator. The applicator was then removed. 3 Steri-Strips were applied over the incision, a band-aid and a bandage was placed which the patient is to remove tomorrow. No complications, the patient tolerated procedure well and was released  home with instructions.    Assessment/Plan:  31 y.o. G0P0000   1. Encounter for removal and reinsertion of Nexplanon Time to switch to a new Nexplanon after 3 yrs.  Well on Nexplanon with no pelvic pain, no BTB.  Easy removal of Nexplanon.  Insertion without difficulty.  No Cx.  Well tolerated by patient.  Post procedure precautions.  2. BRCA2 positive Ca 2 Positive:  Ca 125 normal at 13 on 01/17/22.  Pelvic US scheduled.  3. Screen for STD (sexually transmitted disease)  Full STI screen Negative 01/17/22.  Counseling on BrCa2 screening with Ca 125 normal and informed of Full STI screen Negative for 10 minutes.  Princess Bruins MD, 11:47 AM 01/20/2022

## 2022-02-14 ENCOUNTER — Other Ambulatory Visit: Payer: Self-pay | Admitting: Family Medicine

## 2022-02-14 DIAGNOSIS — Z1231 Encounter for screening mammogram for malignant neoplasm of breast: Secondary | ICD-10-CM

## 2022-03-06 ENCOUNTER — Ambulatory Visit
Admission: RE | Admit: 2022-03-06 | Discharge: 2022-03-06 | Disposition: A | Discharge status: Home / Self Care | Payer: 59 | Source: Ambulatory Visit

## 2022-03-06 DIAGNOSIS — Z1231 Encounter for screening mammogram for malignant neoplasm of breast: Secondary | ICD-10-CM

## 2022-03-14 ENCOUNTER — Ambulatory Visit (INDEPENDENT_AMBULATORY_CARE_PROVIDER_SITE_OTHER): Payer: 59 | Admitting: Obstetrics & Gynecology

## 2022-03-14 ENCOUNTER — Encounter: Payer: Self-pay | Admitting: Obstetrics & Gynecology

## 2022-03-14 ENCOUNTER — Ambulatory Visit (INDEPENDENT_AMBULATORY_CARE_PROVIDER_SITE_OTHER): Payer: 59

## 2022-03-14 VITALS — BP 124/104 | HR 113

## 2022-03-14 DIAGNOSIS — Z1501 Genetic susceptibility to malignant neoplasm of breast: Secondary | ICD-10-CM

## 2022-03-14 DIAGNOSIS — Z1509 Genetic susceptibility to other malignant neoplasm: Secondary | ICD-10-CM

## 2022-03-14 DIAGNOSIS — Z8041 Family history of malignant neoplasm of ovary: Secondary | ICD-10-CM

## 2022-03-14 DIAGNOSIS — I10 Essential (primary) hypertension: Secondary | ICD-10-CM

## 2022-03-14 NOTE — Progress Notes (Signed)
    Bethany Robinson November 29, 1990 [id number]        31 y.o.  G0P0000   RP: BrCa2 positive/Fam h/o Ovarian Ca for Pelvic US  HPI: Well on Nexplanon  x 01/20/22 with no pelvic pain, no BTB.  STI screen Negative 01/17/2022.  BrCa 2 Positive:  Ca 125 normal at 13.    OB History  Gravida Para Term Preterm AB Living  0 0 0 0 0 0  SAB IAB Ectopic Multiple Live Births  0 0 0 0 0    Past medical history,surgical history, problem list, medications, allergies, family history and social history were all reviewed and documented in the EPIC chart.   Directed ROS with pertinent positives and negatives documented in the history of present illness/assessment and plan.  Exam:  Vitals:   03/14/22 1600  BP: (!) 124/104  Pulse: (!) 113  SpO2: 99%   General appearance:  Normal  Pelvic US today: T/V images.  Anteverted uterus normal in size and shape with no myometrial mass.  The uterus is measured at 6.23 x 3.06 x 2.23 cm.  The endometrial lining is thin and symmetrical measured at 2 mm with no mass or thickening seen.  Both ovaries are normal in size with normal follicular pattern and normal perfusion.  No adnexal mass.  No free fluid in the pelvis.   Assessment/Plan:  31 y.o. G0  1. BRCA2 positive Well on Nexplanon  x 01/20/22 with no pelvic pain, no BTB.  STI screen Negative 01/17/2022.  BrCa 2 Positive:  Ca 125 normal at 13.  Pelvic ultrasound findings completely reassuring with bilateral normal ovaries, no adnexal mass and no free fluid in the pelvis.  Findings discussed with patient.  We will follow-up when due for annual gynecologic exam.  2. Family history of ovarian cancer  3. Hypertension, unspecified type  BP 124/104.  Recommend evaluation/management with Fam MD.  Low salt diet and close monitoring.  Princess Bruins MD, 4:27 PM 03/14/2022

## 2022-03-19 ENCOUNTER — Encounter: Payer: Self-pay | Admitting: Obstetrics & Gynecology

## 2022-03-20 NOTE — H&P (Signed)
Subjective:     Patient ID: Bethany Robinson is a 31 y.o. female.   HPI   Returns for follow up discussion breast reduction. Patient diagnosed with BRCA2 mutation following mother's diagnosis of same. MGM with history breast ca. Patient undergoing intensified screening. Mother has undergone bilateral mastectomies with reconstruction (patient not sure which type reconstruction) in [place]. Plan staged reconstruction with breast reduction to prepare for risk reducing NSM.   MMG 02/2022 normal MRI 08/2021 normal   Current I cup. Wt down 15 lb over last year.    Reports several year history neck and back pain. Denies rashes. This has not improved with trial yoga/stretching exercises, OTC pain medication, specialty fitted bras, use of back brace while working at desk. Reports associated migraines   Lives alone. Graduate school [employer], already has one Masters in History. 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 Reports not planning to have kids. Reports mother would come from [place] to assist with post op care.     Review of Systems Remainder 12 point review negative    Objective:   Physical Exam Cardiovascular:     Rate and Rhythm: Regular rhythm. Tachycardia present.     Heart sounds: Normal heart sounds.  Pulmonary:     Effort: Pulmonary effort is normal.     Breath sounds: Normal breath sounds.  Abdominal:     Comments: Sufficient soft tissue for reconstruction  Skin:    Comments: Fitzpatrick 2     +shoulder grooving Breasts: no masses grade 3 ptosis bilateral SN to nipple R 36 L 37 cm BW R 25 L 25 cm Nipple to IMF R 14 L 14 cm      Assessment:     BRCA2 Macromastia    Plan:     Plan breast reduction in preparation for nipple sparing mastectomies.   Reviewed reduction with anchor type scars, OP surgery, drains, post operative visits and limitations, recovery. Diminished sensation nipple and breast skin, risk of nipple loss, wound  healing problems, asymmetry, incidental carcinoma, changes with wt gain/loss, aging, unacceptable cosmetic appearance reviewed.   Additional risks including but not limited to bleeding, hematoma, seroma, damage to adjacent structures, need for additional procedures, infection, blood clots in legs or lungs reviewed.   With staged approach, reviewed this would mean at least 3 surgeries to get to implant placement. Reviewed my preference at time of mastectomy, if pursuing implant based, to place expanders over permanent implants. Reviewed timing of mastectomy approximately 3 months from reduction.   Drain teaching completed. Rx for tramadol given.

## 2022-03-21 ENCOUNTER — Encounter (HOSPITAL_BASED_OUTPATIENT_CLINIC_OR_DEPARTMENT_OTHER): Payer: Self-pay | Admitting: Plastic Surgery

## 2022-03-21 ENCOUNTER — Other Ambulatory Visit: Payer: Self-pay

## 2022-03-24 NOTE — Progress Notes (Signed)
Ensure presurgery (drink by 0400 DOS) and CHG soap given to pt with written instructions. Pt verbalized understanding     Enhanced Recovery after Surgery for Orthopedics Enhanced Recovery after Surgery is a protocol used to improve the stress on your body and your recovery after surgery.  Patient Instructions  The night before surgery:  No food after midnight. ONLY clear liquids after midnight  The day of surgery (if you do NOT have diabetes):  Drink ONE (1) Pre-Surgery Clear Ensure as directed.   This drink was given to you during your hospital  pre-op appointment visit. The pre-op nurse will instruct you on the time to drink the  Pre-Surgery Ensure depending on your surgery time. Finish the drink at the designated time by the pre-op nurse.  Nothing else to drink after completing the  Pre-Surgery Clear Ensure.  The day of surgery (if you have diabetes): Drink ONE (1) Gatorade 2 (G2) as directed. This drink was given to you during your hospital  pre-op appointment visit.  The pre-op nurse will instruct you on the time to drink the   Gatorade 2 (G2) depending on your surgery time. Color of the Gatorade may vary. Red is not allowed. Nothing else to drink after completing the  Gatorade 2 (G2).         If you have questions, please contact your surgeon's office.

## 2022-03-27 NOTE — Anesthesia Preprocedure Evaluation (Signed)
Anesthesia Evaluation  Patient identified by MRN, date of birth, ID band Patient awake    Reviewed: Allergy & Precautions, NPO status , Patient's Chart, lab work & pertinent test results  Airway Mallampati: II  TM Distance: >3 FB Neck ROM: Full    Dental  (+) Dental Advisory Given, Teeth Intact   Pulmonary asthma ,    Pulmonary exam normal        Cardiovascular negative cardio ROS Normal cardiovascular exam     Neuro/Psych  Headaches, Anxiety Depression    GI/Hepatic negative GI ROS, Neg liver ROS,   Endo/Other  BRCA2 positive Obesity  Renal/GU negative Renal ROS  negative genitourinary   Musculoskeletal negative musculoskeletal ROS (+)   Abdominal   Peds  Hematology negative hematology ROS (+)   Anesthesia Other Findings +BRCA2  Reproductive/Obstetrics                           Anesthesia Physical Anesthesia Plan  ASA: 2  Anesthesia Plan: General   Post-op Pain Management: Tylenol PO (pre-op)* and Celebrex PO (pre-op)*   Induction: Intravenous  PONV Risk Score and Plan: 3 and Treatment may vary due to age or medical condition, Ondansetron, Midazolam and Dexamethasone  Airway Management Planned: Oral ETT  Additional Equipment: None  Intra-op Plan:   Post-operative Plan: Extubation in OR  Informed Consent: I have reviewed the patients History and Physical, chart, labs and discussed the procedure including the risks, benefits and alternatives for the proposed anesthesia with the patient or authorized representative who has indicated his/her understanding and acceptance.     Dental advisory given  Plan Discussed with:   Anesthesia Plan Comments:        Anesthesia Quick Evaluation

## 2022-03-28 ENCOUNTER — Ambulatory Visit (HOSPITAL_BASED_OUTPATIENT_CLINIC_OR_DEPARTMENT_OTHER)
Admission: RE | Admit: 2022-03-28 | Discharge: 2022-03-28 | Disposition: A | Discharge status: Home / Self Care | Payer: 59 | Attending: Plastic Surgery | Admitting: Plastic Surgery

## 2022-03-28 ENCOUNTER — Other Ambulatory Visit: Payer: Self-pay

## 2022-03-28 ENCOUNTER — Encounter (HOSPITAL_BASED_OUTPATIENT_CLINIC_OR_DEPARTMENT_OTHER): Payer: Self-pay | Admitting: Plastic Surgery

## 2022-03-28 ENCOUNTER — Ambulatory Visit (HOSPITAL_BASED_OUTPATIENT_CLINIC_OR_DEPARTMENT_OTHER): Payer: 59 | Admitting: Anesthesiology

## 2022-03-28 ENCOUNTER — Encounter (HOSPITAL_BASED_OUTPATIENT_CLINIC_OR_DEPARTMENT_OTHER)
Admission: RE | Disposition: A | Discharge status: Home / Self Care | Payer: Self-pay | Source: Home / Self Care | Attending: Plastic Surgery

## 2022-03-28 DIAGNOSIS — N62 Hypertrophy of breast: Secondary | ICD-10-CM

## 2022-03-28 DIAGNOSIS — M549 Dorsalgia, unspecified: Secondary | ICD-10-CM | POA: Diagnosis not present

## 2022-03-28 DIAGNOSIS — Z6835 Body mass index (BMI) 35.0-35.9, adult: Secondary | ICD-10-CM | POA: Insufficient documentation

## 2022-03-28 DIAGNOSIS — Z4001 Encounter for prophylactic removal of breast: Secondary | ICD-10-CM

## 2022-03-28 DIAGNOSIS — M542 Cervicalgia: Secondary | ICD-10-CM | POA: Insufficient documentation

## 2022-03-28 DIAGNOSIS — E669 Obesity, unspecified: Secondary | ICD-10-CM | POA: Insufficient documentation

## 2022-03-28 DIAGNOSIS — J45909 Unspecified asthma, uncomplicated: Secondary | ICD-10-CM | POA: Diagnosis not present

## 2022-03-28 DIAGNOSIS — Z1501 Genetic susceptibility to malignant neoplasm of breast: Secondary | ICD-10-CM | POA: Diagnosis present

## 2022-03-28 DIAGNOSIS — Z01818 Encounter for other preprocedural examination: Secondary | ICD-10-CM

## 2022-03-28 HISTORY — PX: BREAST REDUCTION SURGERY: SHX8

## 2022-03-28 LAB — POCT PREGNANCY, URINE: Preg Test, Ur: NEGATIVE

## 2022-03-28 SURGERY — MAMMOPLASTY, REDUCTION
Anesthesia: General | Site: Breast | Laterality: Bilateral

## 2022-03-28 MED ORDER — OXYCODONE HCL 5 MG/5ML PO SOLN: 5.0000 mg | Freq: Once | ORAL | Status: AC | PRN

## 2022-03-28 MED ORDER — CHLORHEXIDINE GLUCONATE CLOTH 2 % EX PADS
6.0000 | MEDICATED_PAD | Freq: Once | CUTANEOUS | Status: AC
  Administered 2022-03-28: 6 via TOPICAL

## 2022-03-28 MED ORDER — CELECOXIB 200 MG PO CAPS
200.0000 mg | ORAL_CAPSULE | ORAL | Status: AC
Start: 2022-03-28 — End: 2022-03-28
  Administered 2022-03-28: 200 mg via ORAL

## 2022-03-28 MED ORDER — DEXAMETHASONE SODIUM PHOSPHATE 4 MG/ML IJ SOLN
INTRAMUSCULAR | Status: DC | PRN
  Administered 2022-03-28: 10 mg via INTRAVENOUS

## 2022-03-28 MED ORDER — AMISULPRIDE (ANTIEMETIC) 5 MG/2ML IV SOLN
INTRAVENOUS | Status: AC
  Filled 2022-03-28: qty 4

## 2022-03-28 MED ORDER — ROCURONIUM BROMIDE 100 MG/10ML IV SOLN
INTRAVENOUS | Status: DC | PRN
  Administered 2022-03-28: 100 mg via INTRAVENOUS

## 2022-03-28 MED ORDER — GABAPENTIN 300 MG PO CAPS
ORAL_CAPSULE | ORAL | Status: AC
  Filled 2022-03-28: qty 1

## 2022-03-28 MED ORDER — CHLORHEXIDINE GLUCONATE CLOTH 2 % EX PADS: 6.0000 | MEDICATED_PAD | Freq: Once | CUTANEOUS | Status: DC

## 2022-03-28 MED ORDER — OXYCODONE HCL 5 MG PO TABS
5.0000 mg | ORAL_TABLET | Freq: Once | ORAL | Status: AC | PRN
  Administered 2022-03-28: 5 mg via ORAL

## 2022-03-28 MED ORDER — BUPIVACAINE HCL (PF) 0.5 % IJ SOLN
INTRAMUSCULAR | Status: DC | PRN
  Administered 2022-03-28: 30 mL

## 2022-03-28 MED ORDER — OXYCODONE HCL 5 MG PO TABS
ORAL_TABLET | ORAL | Status: AC
  Filled 2022-03-28: qty 1

## 2022-03-28 MED ORDER — KETAMINE HCL 10 MG/ML IJ SOLN: INTRAMUSCULAR | Status: DC | PRN

## 2022-03-28 MED ORDER — CEFAZOLIN SODIUM-DEXTROSE 2-4 GM/100ML-% IV SOLN
INTRAVENOUS | Status: AC
  Filled 2022-03-28: qty 100

## 2022-03-28 MED ORDER — ONDANSETRON HCL 4 MG/2ML IJ SOLN
INTRAMUSCULAR | Status: DC | PRN
  Administered 2022-03-28: 4 mg via INTRAVENOUS

## 2022-03-28 MED ORDER — 0.9 % SODIUM CHLORIDE (POUR BTL) OPTIME
TOPICAL | Status: DC | PRN
  Administered 2022-03-28: 300 mL

## 2022-03-28 MED ORDER — MIDAZOLAM HCL 5 MG/5ML IJ SOLN
INTRAMUSCULAR | Status: DC | PRN
  Administered 2022-03-28: 2 mg via INTRAVENOUS

## 2022-03-28 MED ORDER — HYDROMORPHONE HCL 1 MG/ML IJ SOLN: 0.2500 mg | INTRAMUSCULAR | Status: DC | PRN

## 2022-03-28 MED ORDER — LACTATED RINGERS IV SOLN
INTRAVENOUS | Status: DC
Start: 2022-03-28 — End: 2022-03-28

## 2022-03-28 MED ORDER — SCOPOLAMINE 1 MG/3DAYS TD PT72
1.0000 | MEDICATED_PATCH | TRANSDERMAL | Status: DC
Start: 2022-03-28 — End: 2022-03-28
  Administered 2022-03-28: 1.5 mg via TRANSDERMAL

## 2022-03-28 MED ORDER — ACETAMINOPHEN 500 MG PO TABS
ORAL_TABLET | ORAL | Status: AC
  Filled 2022-03-28: qty 2

## 2022-03-28 MED ORDER — FENTANYL CITRATE (PF) 100 MCG/2ML IJ SOLN
INTRAMUSCULAR | Status: DC | PRN
  Administered 2022-03-28: 25 ug via INTRAVENOUS
  Administered 2022-03-28 (×2): 50 ug via INTRAVENOUS
  Administered 2022-03-28: 25 ug via INTRAVENOUS
  Administered 2022-03-28 (×3): 50 ug via INTRAVENOUS

## 2022-03-28 MED ORDER — AMISULPRIDE (ANTIEMETIC) 5 MG/2ML IV SOLN
10.0000 mg | Freq: Once | INTRAVENOUS | Status: AC | PRN
  Administered 2022-03-28: 10 mg via INTRAVENOUS

## 2022-03-28 MED ORDER — GABAPENTIN 300 MG PO CAPS
300.0000 mg | ORAL_CAPSULE | ORAL | Status: AC
Start: 2022-03-28 — End: 2022-03-28
  Administered 2022-03-28: 300 mg via ORAL

## 2022-03-28 MED ORDER — MIDAZOLAM HCL 2 MG/2ML IJ SOLN
INTRAMUSCULAR | Status: AC
  Filled 2022-03-28: qty 2

## 2022-03-28 MED ORDER — SUGAMMADEX SODIUM 200 MG/2ML IV SOLN
INTRAVENOUS | Status: DC | PRN
  Administered 2022-03-28: 200 mg via INTRAVENOUS

## 2022-03-28 MED ORDER — LIDOCAINE HCL (CARDIAC) PF 100 MG/5ML IV SOSY
PREFILLED_SYRINGE | INTRAVENOUS | Status: DC | PRN
  Administered 2022-03-28: 100 mg via INTRAVENOUS

## 2022-03-28 MED ORDER — CELECOXIB 200 MG PO CAPS
ORAL_CAPSULE | ORAL | Status: AC
  Filled 2022-03-28: qty 1

## 2022-03-28 MED ORDER — KETAMINE HCL 50 MG/5ML IJ SOSY
PREFILLED_SYRINGE | INTRAMUSCULAR | Status: AC
  Filled 2022-03-28: qty 5

## 2022-03-28 MED ORDER — ONDANSETRON HCL 4 MG/2ML IJ SOLN: 4.0000 mg | Freq: Once | INTRAMUSCULAR | Status: DC | PRN

## 2022-03-28 MED ORDER — SCOPOLAMINE 1 MG/3DAYS TD PT72
MEDICATED_PATCH | TRANSDERMAL | Status: AC
  Filled 2022-03-28: qty 1

## 2022-03-28 MED ORDER — FENTANYL CITRATE (PF) 100 MCG/2ML IJ SOLN
INTRAMUSCULAR | Status: AC
  Filled 2022-03-28: qty 2

## 2022-03-28 MED ORDER — PROPOFOL 10 MG/ML IV BOLUS
INTRAVENOUS | Status: DC | PRN
  Administered 2022-03-28: 200 mg via INTRAVENOUS

## 2022-03-28 MED ORDER — CEFAZOLIN SODIUM-DEXTROSE 2-4 GM/100ML-% IV SOLN
2.0000 g | INTRAVENOUS | Status: AC
  Administered 2022-03-28: 2 g via INTRAVENOUS

## 2022-03-28 MED ORDER — ACETAMINOPHEN 500 MG PO TABS
1000.0000 mg | ORAL_TABLET | ORAL | Status: AC
  Administered 2022-03-28: 1000 mg via ORAL

## 2022-03-28 MED ORDER — KETAMINE HCL 10 MG/ML IJ SOLN
INTRAMUSCULAR | Status: DC | PRN
  Administered 2022-03-28 (×2): 25 mg via INTRAVENOUS

## 2022-03-28 MED ORDER — BUPIVACAINE HCL (PF) 0.5 % IJ SOLN
INTRAMUSCULAR | Status: AC
  Filled 2022-03-28: qty 60

## 2022-03-28 MED ORDER — BUPIVACAINE-EPINEPHRINE (PF) 0.5% -1:200000 IJ SOLN
INTRAMUSCULAR | Status: AC
  Filled 2022-03-28: qty 60

## 2022-03-28 SURGICAL SUPPLY — 53 items
ADH SKN CLS APL DERMABOND .7 (GAUZE/BANDAGES/DRESSINGS) ×2
APL PRP STRL LF DISP 70% ISPRP (MISCELLANEOUS) ×2
BINDER BREAST 3XL (GAUZE/BANDAGES/DRESSINGS) IMPLANT
BINDER BREAST LRG (GAUZE/BANDAGES/DRESSINGS) IMPLANT
BINDER BREAST MEDIUM (GAUZE/BANDAGES/DRESSINGS) IMPLANT
BINDER BREAST XLRG (GAUZE/BANDAGES/DRESSINGS) IMPLANT
BINDER BREAST XXLRG (GAUZE/BANDAGES/DRESSINGS) ×1 IMPLANT
BLADE SURG 10 STRL SS (BLADE) ×8 IMPLANT
BNDG GAUZE DERMACEA FLUFF (GAUZE/BANDAGES/DRESSINGS) ×2
BNDG GAUZE DERMACEA FLUFF 4 (GAUZE/BANDAGES/DRESSINGS) ×2 IMPLANT
BNDG GZE DERMACEA 4 6PLY (GAUZE/BANDAGES/DRESSINGS) ×2
CANISTER SUCT 1200ML W/VALVE (MISCELLANEOUS) ×2 IMPLANT
CHLORAPREP W/TINT 26 (MISCELLANEOUS) ×4 IMPLANT
COVER BACK TABLE 60X90IN (DRAPES) ×2 IMPLANT
COVER MAYO STAND STRL (DRAPES) ×2 IMPLANT
DERMABOND ADVANCED (GAUZE/BANDAGES/DRESSINGS) ×2
DERMABOND ADVANCED .7 DNX12 (GAUZE/BANDAGES/DRESSINGS) ×2 IMPLANT
DRAIN CHANNEL 15F RND FF W/TCR (WOUND CARE) ×2 IMPLANT
DRAPE TOP ARMCOVERS (MISCELLANEOUS) ×2 IMPLANT
DRAPE U-SHAPE 76X120 STRL (DRAPES) ×2 IMPLANT
DRAPE UTILITY XL STRL (DRAPES) ×2 IMPLANT
DRSG PAD ABDOMINAL 8X10 ST (GAUZE/BANDAGES/DRESSINGS) ×4 IMPLANT
ELECT COATED BLADE 2.86 ST (ELECTRODE) ×2 IMPLANT
ELECT REM PT RETURN 9FT ADLT (ELECTROSURGICAL) ×2
ELECTRODE REM PT RTRN 9FT ADLT (ELECTROSURGICAL) ×1 IMPLANT
EVACUATOR SILICONE 100CC (DRAIN) ×2 IMPLANT
GLOVE BIO SURGEON STRL SZ 6 (GLOVE) ×4 IMPLANT
GOWN STRL REUS W/ TWL LRG LVL3 (GOWN DISPOSABLE) ×2 IMPLANT
GOWN STRL REUS W/TWL LRG LVL3 (GOWN DISPOSABLE) ×4
MARKER SKIN DUAL TIP RULER LAB (MISCELLANEOUS) IMPLANT
NDL HYPO 25X1 1.5 SAFETY (NEEDLE) IMPLANT
NEEDLE HYPO 25X1 1.5 SAFETY (NEEDLE) IMPLANT
NS IRRIG 1000ML POUR BTL (IV SOLUTION) ×2 IMPLANT
PACK BASIN DAY SURGERY FS (CUSTOM PROCEDURE TRAY) ×2 IMPLANT
PENCIL SMOKE EVACUATOR (MISCELLANEOUS) ×2 IMPLANT
PIN SAFETY STERILE (MISCELLANEOUS) ×2 IMPLANT
SHEET MEDIUM DRAPE 40X70 STRL (DRAPES) ×4 IMPLANT
SLEEVE SCD COMPRESS KNEE MED (STOCKING) ×2 IMPLANT
SPONGE T-LAP 18X18 ~~LOC~~+RFID (SPONGE) ×7 IMPLANT
STAPLER VISISTAT 35W (STAPLE) ×2 IMPLANT
SUT ETHILON 2 0 FS 18 (SUTURE) ×2 IMPLANT
SUT MNCRL AB 4-0 PS2 18 (SUTURE) ×6 IMPLANT
SUT VIC AB 3-0 PS1 18 (SUTURE) ×18
SUT VIC AB 3-0 PS1 18XBRD (SUTURE) ×6 IMPLANT
SUT VICRYL 4-0 PS2 18IN ABS (SUTURE) ×4 IMPLANT
SUT VLOC 180 P-14 24 (SUTURE) ×4 IMPLANT
SYR BULB IRRIG 60ML STRL (SYRINGE) ×2 IMPLANT
SYR CONTROL 10ML LL (SYRINGE) IMPLANT
TAPE MEASURE VINYL STERILE (MISCELLANEOUS) IMPLANT
TOWEL GREEN STERILE FF (TOWEL DISPOSABLE) ×4 IMPLANT
TUBE CONNECTING 20X1/4 (TUBING) ×2 IMPLANT
UNDERPAD 30X36 HEAVY ABSORB (UNDERPADS AND DIAPERS) ×4 IMPLANT
YANKAUER SUCT BULB TIP NO VENT (SUCTIONS) ×2 IMPLANT

## 2022-03-28 NOTE — Op Note (Signed)
Operative Note   DATE OF OPERATION: 8.1.23  LOCATION: [facility]-outpatient  SURGICAL DIVISION: Plastic Surgery  PREOPERATIVE DIAGNOSES:  1. BRCA2 2. Macromastia  POSTOPERATIVE DIAGNOSES:  same  PROCEDURE:  Bilateral breast reduction  SURGEON: Brinda Thimmappa MD MBA  ASSISTANT: none  ANESTHESIA:  General.   EBL: 45 ml  COMPLICATIONS: None immediate.   INDICATIONS FOR PROCEDURE:  The patient, Bethany Robinson, is a 30 y.o. female born on 12/17/1990, is here for staged breast reconstruction. Plan breast reduction in preparation risk reducing nipple sparing mastectomies.    FINDINGS: Right reduction 620 g Left reduction 611 g  DESCRIPTION OF PROCEDURE:  The patient was marked standing in the preoperative area to mark sternal notch, chest midline, anterior axillary lines, inframammary folds. The location of new nipple areolar complex was marked at level of on inframammary fold on anterior surface breast by palpation. This was marked symmetric over bilateral breasts. With aid of Wise pattern marker, location of new nipple areolar complex and vertical limbs (7 cm) were marked by displacement of breasts along meridian. The patient was taken to the operating room. SCDs were placed and IV antibiotics were given. The patient's operative site was prepped and draped in a sterile fashion. A time out was performed and all information was confirmed to be correct.     Over left breast, superior medial pedicle marked and nipple areolar complex incised with 42 mm diameter marker. Pedicle deepithlialized and developed to chest wall. Breast tissue resected over lower pole. Medial and lateral flaps developed. Additional lateral and superior breast tissue excised. Breast tailor tacked closed.    I then directed attention to right breast where superior medial pedicle designed. NAC incised with 42 mm diameter marker. The pedicle was deepithelialized. Pedicle developed to chest wall. Breast tissue  resected over lower pole. Medial and lateral flaps developed. Additional lateral and superior pole breast tissue excised. Breast tailor tacked closed. Patient assessed for symmetry. Breast cavities irrigated and hemostasis obtained. Local anesthetic infiltrated throughout each breast. 15 Fr JP placed in each breast and secured with 2-0 nylon. Closure completed bilateral with 3-0 vicryl to approximate dermis along inframammary fold and vertical limb. NAC inset with 4-0 vicryl in dermis. Skin closure completed with 4-0 monocryl subcuticular throughout. Tissue adhesive applied. Dry dressing and breast binder applied.   The patient was allowed to wake from anesthesia, extubated and taken to the recovery room in satisfactory condition.   SPECIMENS: right and left breast reduction  DRAINS: 15 Fr JP in right and left breast  Brinda Thimmappa, MD MBA Plastic & Reconstructive Surgery  Office/ physician access line after hours [phone]   

## 2022-03-28 NOTE — Discharge Instructions (Addendum)
Tylenol 1000 mg given at 6:42 p.m. Oxycodone 5 mg given at 12:00 p.m.  Post Anesthesia Home Care Instructions  Activity: Get plenty of rest for the remainder of the day. A responsible individual must stay with you for 24 hours following the procedure.  For the next 24 hours, DO NOT: -Drive a car -Advertising copywriter -Drink alcoholic beverages -Take any medication unless instructed by your physician -Make any legal decisions or sign important papers.  Meals: Start with liquid foods such as gelatin or soup. Progress to regular foods as tolerated. Avoid greasy, spicy, heavy foods. If nausea and/or vomiting occur, drink only clear liquids until the nausea and/or vomiting subsides. Call your physician if vomiting continues.  Special Instructions/Symptoms: Your throat may feel dry or sore from the anesthesia or the breathing tube placed in your throat during surgery. If this causes discomfort, gargle with warm salt water. The discomfort should disappear within 24 hours.  If you had a scopolamine patch placed behind your ear for the management of post- operative nausea and/or vomiting:  1. The medication in the patch is effective for 72 hours, after which it should be removed.  Wrap patch in a tissue and discard in the trash. Wash hands thoroughly with soap and water. 2. You may remove the patch earlier than 72 hours if you experience unpleasant side effects which may include dry mouth, dizziness or visual disturbances. 3. Avoid touching the patch. Wash your hands with soap and water after contact with the patch.      Next time you can take Tylenol at 1230pm today. Next time you can take Ibuprofen at 230pm today.  About my Jackson-Pratt Bulb Drain  What is a Jackson-Pratt bulb? A Jackson-Pratt is a soft, round device used to collect drainage. It is connected to a long, thin drainage catheter, which is held in place by one or two small stiches near your surgical incision site. When the bulb is  squeezed, it forms a vacuum, forcing the drainage to empty into the bulb.  Emptying the Jackson-Pratt bulb- To empty the bulb: 1. Release the plug on the top of the bulb. 2. Pour the bulb's contents into a measuring container which your nurse will provide. 3. Record the time emptied and amount of drainage. Empty the drain(s) as often as your     doctor or nurse recommends.  Date                  Time                    Amount (Drain 1)                 Amount (Drain 2)  _____________________________________________________________________  _____________________________________________________________________  _____________________________________________________________________  _____________________________________________________________________  _____________________________________________________________________  _____________________________________________________________________  _____________________________________________________________________  _____________________________________________________________________  Squeezing the Jackson-Pratt Bulb- To squeeze the bulb: 1. Make sure the plug at the top of the bulb is open. 2. Squeeze the bulb tightly in your fist. You will hear air squeezing from the bulb. 3. Replace the plug while the bulb is squeezed. 4. Use a safety pin to attach the bulb to your clothing. This will keep the catheter from     pulling at the bulb insertion site.  When to call your doctor- Call your doctor if: Drain site becomes red, swollen or hot. You have a fever greater than 101 degrees F. There is oozing at the drain site. Drain falls out (apply a guaze bandage over the drain hole and  secure it with tape). Drainage increases daily not related to activity patterns. (You will usually have more drainage when you are active than when you are resting.) Drainage has a bad odor.

## 2022-03-28 NOTE — Anesthesia Postprocedure Evaluation (Signed)
Anesthesia Post Note  Patient: Umi Mainor  Procedure(s) Performed: MAMMARY REDUCTION  (BREAST) (Bilateral: Breast)     Patient location during evaluation: PACU Anesthesia Type: General Level of consciousness: awake and alert Pain management: pain level controlled Vital Signs Assessment: post-procedure vital signs reviewed and stable Respiratory status: spontaneous breathing, nonlabored ventilation and respiratory function stable Cardiovascular status: blood pressure returned to baseline and stable Postop Assessment: no apparent nausea or vomiting Anesthetic complications: no   No notable events documented.  Last Vitals:  Vitals:   03/28/22 1200 03/28/22 1239  BP: (!) 133/99 (!) 130/100  Pulse: 100 95  Resp: 16 16  Temp: 36.9 C   SpO2: 96% 96%    Last Pain:  Vitals:   03/28/22 1145  TempSrc:   PainSc: 4                  Lucretia Kern

## 2022-03-28 NOTE — Interval H&P Note (Signed)
History and Physical Interval Note:  03/28/2022 7:14 AM  Bethany Robinson  has presented today for surgery, with the diagnosis of BRCA2.  The various methods of treatment have been discussed with the patient and family. After consideration of risks, benefits and other options for treatment, the patient has consented to  Procedure(s): MAMMARY REDUCTION  (BREAST) (Bilateral) as a surgical intervention.  The patient's history has been reviewed, patient examined, no change in status, stable for surgery.  I have reviewed the patient's chart and labs.  Questions were answered to the patient's satisfaction.     Arnoldo Hooker Zhyon Antenucci

## 2022-03-28 NOTE — Transfer of Care (Signed)
Immediate Anesthesia Transfer of Care Note  Patient: Bethany Robinson  Procedure(s) Performed: MAMMARY REDUCTION  (BREAST) (Bilateral: Breast)  Patient Location: PACU  Anesthesia Type:General  Level of Consciousness: awake, alert , oriented and drowsy  Airway & Oxygen Therapy: Patient Spontanous Breathing and Patient connected to face mask oxygen  Post-op Assessment: Report given to RN and Post -op Vital signs reviewed and stable  Post vital signs: Reviewed and stable  Last Vitals:  Vitals Value Taken Time  BP    Temp    Pulse 106 03/28/22 1041  Resp    SpO2 97 % 03/28/22 1041  Vitals shown include unvalidated device data.  Last Pain:  Vitals:   03/28/22 0631  TempSrc: Oral  PainSc: 0-No pain      Patients Stated Pain Goal: 4 (03/28/22 0631)  Complications: No notable events documented.

## 2022-03-28 NOTE — Anesthesia Procedure Notes (Signed)
Procedure Name: Intubation Date/Time: 03/28/2022 7:35 AM  Performed by: Willa Frater, CRNAPre-anesthesia Checklist: Patient identified, Emergency Drugs available, Suction available and Patient being monitored Patient Re-evaluated:Patient Re-evaluated prior to induction Oxygen Delivery Method: Circle system utilized Preoxygenation: Pre-oxygenation with 100% oxygen Induction Type: IV induction Ventilation: Mask ventilation without difficulty Laryngoscope Size: Mac and 3 Grade View: Grade I Tube type: Oral Tube size: 7.0 mm Number of attempts: 1 Airway Equipment and Method: Stylet and Oral airway Placement Confirmation: ETT inserted through vocal cords under direct vision, positive ETCO2 and breath sounds checked- equal and bilateral Secured at: 22 cm Tube secured with: Tape Dental Injury: Teeth and Oropharynx as per pre-operative assessment

## 2022-03-29 ENCOUNTER — Encounter (HOSPITAL_BASED_OUTPATIENT_CLINIC_OR_DEPARTMENT_OTHER): Payer: Self-pay | Admitting: Plastic Surgery

## 2022-03-29 LAB — SURGICAL PATHOLOGY

## 2022-03-29 NOTE — Progress Notes (Signed)
Left message stating courtesy call and if any questions or concerns please call the doctors office.  

## 2022-05-19 ENCOUNTER — Other Ambulatory Visit: Payer: Self-pay | Admitting: Neurology

## 2022-05-30 ENCOUNTER — Encounter: Payer: Self-pay | Admitting: Family Medicine

## 2022-05-30 ENCOUNTER — Ambulatory Visit (INDEPENDENT_AMBULATORY_CARE_PROVIDER_SITE_OTHER): Payer: 59 | Admitting: Family Medicine

## 2022-05-30 VITALS — BP 137/99 | HR 80 | Temp 98.3°F | Ht 66.0 in | Wt 223.0 lb

## 2022-05-30 DIAGNOSIS — N62 Hypertrophy of breast: Secondary | ICD-10-CM | POA: Diagnosis not present

## 2022-05-30 DIAGNOSIS — Z23 Encounter for immunization: Secondary | ICD-10-CM

## 2022-05-30 DIAGNOSIS — F418 Other specified anxiety disorders: Secondary | ICD-10-CM | POA: Diagnosis not present

## 2022-05-30 DIAGNOSIS — J301 Allergic rhinitis due to pollen: Secondary | ICD-10-CM

## 2022-05-30 DIAGNOSIS — Z1501 Genetic susceptibility to malignant neoplasm of breast: Secondary | ICD-10-CM

## 2022-05-30 DIAGNOSIS — Z1509 Genetic susceptibility to other malignant neoplasm: Secondary | ICD-10-CM

## 2022-05-30 DIAGNOSIS — Z1507 Genetic susceptibility to malignant neoplasm of urinary tract: Secondary | ICD-10-CM

## 2022-05-30 NOTE — Progress Notes (Signed)
[facility] [address] [place] [address] Dept: [phone] Dept Fax: [phone]  Chronic Care Office Visit  Subjective:    Patient ID: Bethany Robinson, female    DOB: 1991/02/04, 31 y.o..   MRN: [id number]  Chief Complaint  Patient presents with   Follow-up    6 month follow up depression and anxiety.     History of Present Illness:  Patient is in today for reassessment of chronic medical issues.  Bethany Robinson has a history of depression with anxiety. She continues to be seen at  [facility] by Deveron Furlong (Psych NP). She is managed on clonidine 0.1 mg daily and doxepin 25 mg daily. She also takes PRN Ativan for acute anxiety issues. She continues to struggle with multiple stressors in her life. She is working full-time, taking school courses, and had surgery this summer. She finds this all to be a bit overwhelming at times.   Bethany Robinson has a history of macromastia. This summer, she underwent a breast reduction surgery. She notes that she is mostly healed from this. She has a history of a BRCA2 gene variant and multiple family members with breast cancer. She is looking towards breast removal in about a year with breast reconstruction using abdominal fat.  Bethany Robinson has a history of allergic rhinitis. She Notes some nighttime congestion that has been interfering with her sleep. She does use a steroid nasal spray on a daily basis.   Past Medical History: Patient Active Problem List   Diagnosis Date Noted   Exercise-induced asthma 05/30/2021   Depression with anxiety 05/30/2021   Seasonal allergic rhinitis 05/30/2021   Muscle ache 05/30/2021   BRCA2 gene mutation positive 08/09/2018   Family history of breast cancer    Family history of BRCA2 gene positive    Common migraine with intractable migraine 09/27/2017   Past Surgical History:  Procedure Laterality Date   BREAST REDUCTION SURGERY  Bilateral 03/28/2022   Procedure: MAMMARY REDUCTION  (BREAST);  Surgeon: Irene Limbo, MD;  Location: [facility];  Service: Plastics;  Laterality: Bilateral;   TONSILLECTOMY     Family History  Problem Relation Age of Onset   BRCA 1/2 Mother        BRCA2 pos   Migraines Father    BRCA 1/2 Maternal Aunt        BRCA2 pos   Diabetes Paternal Uncle    Breast cancer Maternal Grandmother    Diabetes Maternal Grandfather    Depression Paternal Grandmother    Heart failure Paternal Grandmother    Crohn's disease Paternal Grandmother    CVA Paternal Grandmother    Other Paternal Grandmother        CREST    Sjogren's syndrome Paternal Grandmother    Sjogren's syndrome Paternal Great-grandmother    Heart attack Maternal Great-grandmother 22   Hypertension Maternal Great-grandmother    Bladder Cancer Maternal Great-grandmother    Breast cancer Other     Outpatient Medications Prior to Visit  Medication Sig Dispense Refill   albuterol (VENTOLIN HFA) 108 (90 Base) MCG/ACT inhaler TAKE 2 PUFFS BY MOUTH EVERY 6 HOURS AS NEEDED FOR WHEEZE OR SHORTNESS OF BREATH 6.7 each 2   amphetamine-dextroamphetamine (ADDERALL) 15 MG tablet Take 0.5 tablets by mouth 2 (two) times daily.     BuPROPion HBr (APLENZIN) 348 MG TB24 Take 1 tablet by mouth daily.     Cetirizine HCl (ZYRTEC PO) Take 10 mg by mouth daily.  cloNIDine (CATAPRES) 0.1 MG tablet Take 0.1 mg by mouth daily.     doxepin (SINEQUAN) 25 MG capsule Take 100 mg by mouth daily.     EMGALITY 120 MG/ML SOAJ INJECT SUBCUTANEOUSLY 120MG  EVERY 28 DAYS 3 mL 0   etonogestrel (NEXPLANON) 68 MG IMPL implant 1 each by Subdermal route once.     fluticasone (FLONASE) 50 MCG/ACT nasal spray Place 2 sprays into both nostrils daily. 16 g 6   fluticasone (FLOVENT HFA) 110 MCG/ACT inhaler Inhale 1 puff into the lungs in the morning and at bedtime. 1 each 5   LORazepam (ATIVAN) 0.5 MG tablet Take 1 tablet (0.5 mg total) by mouth continuous  as needed for anxiety. 5 tablet 0   rizatriptan (MAXALT) 10 MG tablet Take 10 mg by mouth as needed for migraine. May repeat in 2 hours if needed     valACYclovir (VALTREX) 1000 MG tablet SMARTSIG:2 Tablet(s) By Mouth Morning-Night     cetirizine (ZYRTEC) 10 MG tablet Take 1 tablet by mouth daily.     buPROPion (WELLBUTRIN SR) 150 MG 12 hr tablet Take 150 mg by mouth every morning.     promethazine (PHENERGAN) 25 MG tablet Take 1 tablet (25 mg total) by mouth every 8 (eight) hours as needed for nausea or vomiting. 20 tablet 0   No facility-administered medications prior to visit.   Allergies  Allergen Reactions   Hydrocodone Swelling   Topamax [Topiramate]     Cognitive slowing    Objective:   Today's Vitals   05/30/22 1310  BP: (!) 136/92  Pulse: 80  Temp: 98.3 F (36.8 C)  TempSrc: Temporal  SpO2: 99%  Weight: 223 lb (101.2 kg)  Height: 5' 6" (1.676 m)   Body mass index is 35.99 kg/m.   General: Well developed, well nourished. No acute distress. Psych: Alert and oriented. Normal mood and affect.  Health Maintenance Due  Topic Date Due   COVID-19 Vaccine (4 - Moderna series) 10/05/2020   INFLUENZA VACCINE  03/28/2022        05/30/2022    2:08 PM 05/30/2022    1:09 PM 10/10/2021    2:17 PM  Depression screen PHQ 2/9  Decreased Interest _0 Down, Depressed, Hopeless _1 PHQ - 2 Score _2 Altered sleeping 2  2  Tired, decreased energy 3  3  Change in appetite 3  2  Feeling bad or failure about yourself  2  3  Trouble concentrating 1  2  Moving slowly or fidgety/restless 1  1  Suicidal thoughts 0  1  PHQ-9 Score 16  18  Difficult doing work/chores Somewhat difficult  Somewhat difficult   Assessment & Plan:   1. Depression with anxiety Bethany Robinson's depression and anxiety remain only marginally managed with her current medications. I did encourage her to start back to getting some regular physical activity, as this would help to improve her modo and  help with her recent weight gain. She is juggling a great deal right now, so I also encouraged her to give herself a certain amount of grace in being able to accomplish everything. She will continue to work with Ms. Dellis Filbert.  2. Seasonal allergic rhinitis due to pollen Continue use of Flonase 1 spray each nostril daily. I recommend she add nasal saline washes at bedtime.  3. Macromastia 4. BRCA2 gene mutation positive Healing at this point. She will follow up with Dr. Iran Planas as scheduled. Considering prophylactic  mastectomy with breast reconstruction next year.  Return in about 6 months (around 11/29/2022) for Reassessment.   Haydee Salter, MD

## 2022-06-28 ENCOUNTER — Encounter: Payer: Self-pay | Admitting: Family Medicine

## 2022-06-28 DIAGNOSIS — J4520 Mild intermittent asthma, uncomplicated: Secondary | ICD-10-CM

## 2022-07-10 ENCOUNTER — Encounter: Payer: Self-pay | Admitting: Family Medicine

## 2022-07-25 ENCOUNTER — Other Ambulatory Visit: Payer: Self-pay | Admitting: Neurology

## 2022-08-02 ENCOUNTER — Encounter: Payer: Self-pay | Admitting: Family Medicine

## 2022-08-02 ENCOUNTER — Ambulatory Visit: Payer: 59 | Admitting: Family Medicine

## 2022-08-02 VITALS — BP 124/80 | HR 69 | Temp 98.2°F | Ht 66.0 in | Wt 220.8 lb

## 2022-08-02 DIAGNOSIS — J069 Acute upper respiratory infection, unspecified: Secondary | ICD-10-CM

## 2022-08-02 NOTE — Progress Notes (Signed)
[facility] [address] [place] [address] Dept: [phone] Dept Fax: [phone]  Office Visit  Subjective:    Patient ID: Bethany Robinson, female    DOB: 01/28/1991, 31 y.o..   MRN: [id number]  Chief Complaint  Patient presents with   Acute Visit    ST, congestion, troble taking deep breathe x 5 days.  Negative home covid test.  Has been using Albuterol, OTC cold and flu.      History of Present Illness:  Patient is in today with a 5-day history of sore throat, nasal congestion, and dyspnea. Bethany Robinson has a history of exercise-induced asthma. She notes her chest has been feeling tight, like she can't get in enough air. She has been using her albuterol inhaler, but was concerned about using it too much. She has also been taking an OTC cold & flu med. She is drinking ginger tea with honey as well.  Past Medical History: Patient Active Problem List   Diagnosis Date Noted   Exercise-induced asthma 05/30/2021   Depression with anxiety 05/30/2021   Seasonal allergic rhinitis 05/30/2021   Muscle ache 05/30/2021   BRCA2 gene mutation positive 08/09/2018   Family history of breast cancer    Family history of BRCA2 gene positive    Common migraine with intractable migraine 09/27/2017   Past Surgical History:  Procedure Laterality Date   BREAST REDUCTION SURGERY Bilateral 03/28/2022   Procedure: MAMMARY REDUCTION  (BREAST);  Surgeon: Irene Limbo, MD;  Location: [facility];  Service: Plastics;  Laterality: Bilateral;   TONSILLECTOMY     Family History  Problem Relation Age of Onset   BRCA 1/2 Mother        BRCA2 pos   Migraines Father    BRCA 1/2 Maternal Aunt        BRCA2 pos   Diabetes Paternal Uncle    Breast cancer Maternal Grandmother    Diabetes Maternal Grandfather    Depression Paternal Grandmother    Heart failure Paternal Grandmother    Crohn's disease Paternal Grandmother    CVA  Paternal Grandmother    Other Paternal Grandmother        CREST    Sjogren's syndrome Paternal Grandmother    Sjogren's syndrome Paternal Great-grandmother    Heart attack Maternal Great-grandmother 32   Hypertension Maternal Great-grandmother    Bladder Cancer Maternal Great-grandmother    Breast cancer Other    Outpatient Medications Prior to Visit  Medication Sig Dispense Refill   albuterol (VENTOLIN HFA) 108 (90 Base) MCG/ACT inhaler TAKE 2 PUFFS BY MOUTH EVERY 6 HOURS AS NEEDED FOR WHEEZE OR SHORTNESS OF BREATH 6.7 each 2   amphetamine-dextroamphetamine (ADDERALL) 15 MG tablet Take 0.5 tablets by mouth 2 (two) times daily.     BuPROPion HBr (APLENZIN) 348 MG TB24 Take 1 tablet by mouth daily.     cetirizine (ZYRTEC) 10 MG tablet Take 1 tablet by mouth daily.     Cetirizine HCl (ZYRTEC PO) Take 10 mg by mouth daily.      cloNIDine (CATAPRES) 0.1 MG tablet Take 0.1 mg by mouth daily.     doxepin (SINEQUAN) 25 MG capsule Take 100 mg by mouth daily.     etonogestrel (NEXPLANON) 68 MG IMPL implant 1 each by Subdermal route once.     fluticasone (FLONASE) 50 MCG/ACT nasal spray Place 2 sprays into both nostrils daily. 16 g 6   fluticasone (FLOVENT HFA) 110 MCG/ACT inhaler Inhale 1 puff into  the lungs in the morning and at bedtime. 1 each 5   Galcanezumab-gnlm (EMGALITY) 120 MG/ML SOAJ INJECT SUBCUTANEOUSLY 120 MG  EVERY 28 DAYS 1 mL 0   LORazepam (ATIVAN) 0.5 MG tablet Take 1 tablet (0.5 mg total) by mouth continuous as needed for anxiety. 5 tablet 0   rizatriptan (MAXALT) 10 MG tablet Take 10 mg by mouth as needed for migraine. May repeat in 2 hours if needed     valACYclovir (VALTREX) 1000 MG tablet SMARTSIG:2 Tablet(s) By Mouth Morning-Night     No facility-administered medications prior to visit.   Allergies  Allergen Reactions   Hydrocodone Swelling   Topamax [Topiramate]     Cognitive slowing   Objective:   Today's Vitals   08/02/22 1256  BP: 124/80  Pulse: 69  Temp:  98.2 F (36.8 C)  TempSrc: Temporal  SpO2: 94%  Weight: 220 lb 12.8 oz (100.2 kg)  Height: 5' 6" (1.676 m)   Body mass index is 35.64 kg/m.   General: Well developed, well nourished. No acute distress. HEENT: Normocephalic, non-traumatic. Eyes are clear. External ears normal. EAC and TMs   normal bilaterally. Nose mildly congested. Mucous membranes moist. Oropharynx shows   some moderate mucous streaking. Good dentition. Neck: Supple. No lymphadenopathy. No thyromegaly. Lungs: Clear to auscultation bilaterally. No wheezing, rales or rhonchi. CV: RRR without murmurs or rubs. Pulses 2+ bilaterally.  Psych: Alert and oriented. Normal mood and affect.  There are no preventive care reminders to display for this patient.    Assessment & Plan:   1. Viral URI with cough Discussed home care for viral illness, including rest, pushing fluids, and OTC medications as needed for symptom relief. Recommend continued hot tea with honey for sore throat symptoms. May continue albuterol MDI 2 puffs every 6 hours as needed. Follow-up if needed for worsening or persistent symptoms.  Return if symptoms worsen or fail to improve.   Haydee Salter, MD

## 2022-08-15 ENCOUNTER — Telehealth: Payer: Self-pay

## 2022-08-15 ENCOUNTER — Other Ambulatory Visit (HOSPITAL_COMMUNITY): Payer: Self-pay

## 2022-08-15 NOTE — Telephone Encounter (Signed)
Patient Advocate Encounter   Received notification from [facility] that prior authorization is required for Emgality 120MG /ML auto-injectors (migraine)   This medication does not require a prior authorization at this time.   A 3 month supply was picked up 06-05-2022 and is too soon to fill again until 08-28-2022

## 2022-08-16 ENCOUNTER — Telehealth: Payer: Self-pay

## 2022-08-16 NOTE — Telephone Encounter (Signed)
Pharmacy Patient Advocate Encounter   Received notification from [employer] that prior authorization for Emgality 120MG /ML auto-injectors (migraine) is required/requested.    PA submitted on 08/16/2022  via CoverMyMeds  Key [id number]  Status is pending

## 2022-08-19 ENCOUNTER — Other Ambulatory Visit: Payer: Self-pay | Admitting: Neurology

## 2022-08-22 NOTE — Telephone Encounter (Signed)
Pharmacy Patient Advocate Encounter  Prior Authorization for Emgality 120MG /ML auto-injectors  has been approved.    PA#  Effective dates: 08/16/2022 through 08/17/2023

## 2022-09-05 NOTE — Progress Notes (Unsigned)
NEUROLOGY FOLLOW UP OFFICE NOTE  Bethany Robinson [id number]  Assessment/Plan:   1. Migraine without aura,without status migrainosus, not intractable 2.  Migraine with aura, without status migrainosus, not intractable 3.  Primary stabbing headache   1.  Headache prevention:  Emgality 2.  Headache rescue:  Ibuprofen or rizatriptan 10mg  3.  Limit use of pain relievers to no more than 2 days out of week to prevent risk of rebound or medication-overuse headache. 4.  Keep headache diary 5.  Follow up one year ***  Subjective:  Bethany Robinson is a 32 year old right-handed female with anxiety who follows up for migraines and pain in fingertips.   UPDATE: Intensity:  6/10 Duration:  2 hours Frequency: 2 a month Rescue therapy:  Ibuprofen, rarely rizatriptan Current NSAIDS:  Aleve, ibuprofen Current analgesics:  Excedrin Current triptans:  Rizatriptan 10mg  Current ergotamine:  none Current anti-emetic:  none Current muscle relaxants:  none Current anti-anxiolytic:  none Current sleep aide:  trazodone Current Antihypertensive medications:  clonidine Current Antidepressant medications:  bupropion 300mg , doxepin; trazodone Current Anticonvulsant medications:  none Current anti-CGRP:  Emgality Current Vitamins/Herbal/Supplements:  D, B12 (sometimes) Current Antihistamines/Decongestants:  Zyrtec Other therapy:  none Hormone/birth control:  Nexplanon   When she was having an MRI last month, she had numbness in her hands, involving the first 3 digits of both hands.  Afterwards it resolved after she finished the study.  It has not recurred.    Caffeine:  1-2 cups of coffee or 1 soda 3 to 4 days a week Alcohol:  Socially (2 to 3 drinks a week) Smoker:  no Diet:  Needs to increase water intake Exercise:  no Depression:  yes; Anxiety:  yes Other pain:  general Sleep hygiene:  suboptimal   HISTORY:  She has history of various neurologic symptoms since 2018, such as dizziness,  numbness and tingling of hands feet, and legs, word-finding difficulty/stuttering and fatigue.  She has tremors in hand and has trouble with dexterity and fine-finger movements.  Symptoms are intermittent.  She had workup performed by a previous neurologist, which was negative.  MRI of brain with and without contrast from 10/02/17 was normal.  Symptoms subsequently resolved.  On 04/13/19, she presented to the ED for full-body twitching and muscle spasms that started after ingesting marijuana-laced gummy bear, which she has eaten in the past without complication.  She was treated with IVF and Ativan.  Following this event, she started to experience her previous symptoms, such as fatigue, paresthesias and word-finding difficulty.  B12 316; TSH 2.380.  Headaches have gotten worse as well.   She has several headaches: She reports ice-pick headaches:  Usually right-temple but may vary in location.  They are severe stabbing pain.  However she has a constant non-throbbing pain in various locations (right sided, behind head or behind eye).   Her typical migraines, since middle school, are a severe right occipital throbbing headache associated photophobia, phonophobia, sometimes nausea but no vomiting, visual disturbance or unilateral numbness and weakness.  They typically last 2 hours and occur maybe 2 to 3 times month.  No specific triggers.  They are relieved with analgesics and sleep.   In early 2021, she started experiencing pain in all of her fingertips, described as a "deep bruising" pain, not a burning, stinging, or electric pain.  It often is aggravated when applying any degree of pressure on her fingertips.  No associated numbness or tingling.  She reports difficulty holding objects in her hands.  Sometimes  her fingers turn white.  She has tension in her shoulders but no neck pain..  Symptoms somewhat improved after getting a neck adjustment by a chiropractor but have not resolved.  She notes some mild tingling  along the outside of her ankles and feet.  Rheumatologic workup from January, including ANA, ds-DNA, Scl-70, sed rate 5, and CRP 3, were unremarkable.  Labs from August 2020 included TSH 2.380.  Labs included B12 576, vitamin D 19.8 (started supplement).  She did not tolerate Cymbalta.  I did not have an explanation for her symptoms.   Past NSAIDS:  Naproxen 500mg  Past analgesics:  Tylenol Past abortive triptans:  Sumatriptan 50mg  Past abortive ergotamine:  none Past muscle relaxants:  none Past anti-emetic:  none Past antihypertensive medications:  none Past antidepressant medications:  venlafaxine XR 75mg , Cymbalta 60mg  Past anticonvulsant medications:  topiramate 75mg  (somewhat helpful but cognitive deficits), gabapentin 100mg  in AM and 200mg  in PM Past anti-CGRP:  none Past vitamins/Herbal/Supplements:  none Past antihistamines/decongestants:  none Other past therapies:  none     Family history of headache:  Father (migraine with aura such as right sided body numbness) Other family history:  Paternal grandmother (CREST syndrome)  PAST MEDICAL HISTORY: Past Medical History:  Diagnosis Date   Anxiety    Common migraine with intractable migraine 09/27/2017   Depression    Family history of BRCA2 gene positive    Family history of breast cancer    HSV infection    oral    MEDICATIONS: Current Outpatient Medications on File Prior to Visit  Medication Sig Dispense Refill   albuterol (VENTOLIN HFA) 108 (90 Base) MCG/ACT inhaler TAKE 2 PUFFS BY MOUTH EVERY 6 HOURS AS NEEDED FOR WHEEZE OR SHORTNESS OF BREATH 6.7 each 2   amphetamine-dextroamphetamine (ADDERALL) 15 MG tablet Take 0.5 tablets by mouth 2 (two) times daily.     BuPROPion HBr (APLENZIN) 348 MG TB24 Take 1 tablet by mouth daily.     Cetirizine HCl (ZYRTEC PO) Take 10 mg by mouth daily.      cloNIDine (CATAPRES) 0.1 MG tablet Take 0.1 mg by mouth daily.     doxepin (SINEQUAN) 25 MG capsule Take 100 mg by mouth daily.      EMGALITY 120 MG/ML SOAJ INJECT SUBCUTANEOUSLY THE  CONTENTS OF 1 PEN (120MG ) EVERY  28 DAYS 1 mL 0   etonogestrel (NEXPLANON) 68 MG IMPL implant 1 each by Subdermal route once.     fluticasone (FLONASE) 50 MCG/ACT nasal spray Place 2 sprays into both nostrils daily. 16 g 6   fluticasone (FLOVENT HFA) 110 MCG/ACT inhaler Inhale 1 puff into the lungs in the morning and at bedtime. 1 each 5   LORazepam (ATIVAN) 0.5 MG tablet Take 1 tablet (0.5 mg total) by mouth continuous as needed for anxiety. 5 tablet 0   rizatriptan (MAXALT) 10 MG tablet Take 10 mg by mouth as needed for migraine. May repeat in 2 hours if needed     valACYclovir (VALTREX) 1000 MG tablet SMARTSIG:2 Tablet(s) By Mouth Morning-Night     No current facility-administered medications on file prior to visit.    ALLERGIES: Allergies  Allergen Reactions   Hydrocodone Swelling   Topamax [Topiramate]     Cognitive slowing    FAMILY HISTORY: Family History  Problem Relation Age of Onset   BRCA 1/2 Mother        BRCA2 pos   Migraines Father    BRCA 1/2 Maternal Aunt  BRCA2 pos   Diabetes Paternal Uncle    Breast cancer Maternal Grandmother    Diabetes Maternal Grandfather    Depression Paternal Grandmother    Heart failure Paternal Grandmother    Crohn's disease Paternal Grandmother    CVA Paternal Grandmother    Other Paternal Grandmother        CREST    Sjogren's syndrome Paternal Grandmother    Sjogren's syndrome Paternal Great-grandmother    Heart attack Maternal Great-grandmother 66   Hypertension Maternal Great-grandmother    Bladder Cancer Maternal Great-grandmother    Breast cancer Other       Objective:  Blood pressure (!) 131/93, pulse (!) 101, resp. rate 20, height 5\' 6"  (1.676 m), weight 219 lb (99.3 kg), SpO2 95 %. General: No acute distress.  Patient appears well-groomed.   Head:  Normocephalic/atraumatic Eyes:  Fundi examined but not visualized Neck: supple, no paraspinal tenderness, full  range of motion Heart:  Regular rate and rhythm Neurological Exam: ***   Metta Clines, DO  CC: Bethany Marker, MD

## 2022-09-06 ENCOUNTER — Ambulatory Visit: Payer: 59 | Admitting: Neurology

## 2022-09-06 ENCOUNTER — Encounter: Payer: Self-pay | Admitting: Neurology

## 2022-09-06 ENCOUNTER — Other Ambulatory Visit (HOSPITAL_COMMUNITY): Payer: Self-pay

## 2022-09-06 VITALS — BP 120/80 | HR 114 | Resp 20 | Ht 66.0 in | Wt 220.0 lb

## 2022-09-06 DIAGNOSIS — G43109 Migraine with aura, not intractable, without status migrainosus: Secondary | ICD-10-CM

## 2022-09-09 ENCOUNTER — Other Ambulatory Visit: Payer: Self-pay | Admitting: Neurology

## 2022-09-18 ENCOUNTER — Encounter: Payer: Self-pay | Admitting: Family Medicine

## 2022-09-18 DIAGNOSIS — Z1501 Genetic susceptibility to malignant neoplasm of breast: Secondary | ICD-10-CM

## 2022-10-06 ENCOUNTER — Ambulatory Visit
Admission: RE | Admit: 2022-10-06 | Discharge: 2022-10-06 | Disposition: A | Discharge status: Home / Self Care | Payer: 59 | Source: Ambulatory Visit | Attending: Family Medicine | Admitting: Family Medicine

## 2022-10-06 DIAGNOSIS — Z1501 Genetic susceptibility to malignant neoplasm of breast: Secondary | ICD-10-CM

## 2022-10-06 MED ORDER — GADOPICLENOL 0.5 MMOL/ML IV SOLN
10.0000 mL | Freq: Once | INTRAVENOUS | Status: AC | PRN
  Administered 2022-10-06: 10 mL via INTRAVENOUS

## 2022-11-20 ENCOUNTER — Ambulatory Visit: Payer: 59 | Admitting: Family Medicine

## 2022-11-20 VITALS — BP 116/68 | HR 77 | Temp 98.7°F | Ht 66.0 in | Wt 225.2 lb

## 2022-11-20 DIAGNOSIS — U071 COVID-19: Secondary | ICD-10-CM | POA: Diagnosis not present

## 2022-11-20 LAB — POC COVID19 BINAXNOW: SARS Coronavirus 2 Ag: POSITIVE — AB

## 2022-11-20 LAB — POCT INFLUENZA A/B
Influenza A, POC: NEGATIVE
Influenza B, POC: NEGATIVE

## 2022-11-20 NOTE — Progress Notes (Signed)
[facility] [address] [place] [address] Dept: [phone] Dept Fax: [phone]  Office Visit  Subjective:    Patient ID: Bethany Robinson, female    DOB: [birth year], 32 y.o..   MRN: [id number]  Chief Complaint  Patient presents with   Cough    C/o having  St, fatigue, fever, nasal congestion/cough x 3 days.  Has been taking Alka Seltzer Plus.   History of Present Illness:  Patient is in today with a 4-day history of fever, sore throat, fatigue, nasal congestion and cough. This started on Friday, though she felt a little under the weather the previous day. She had a temperature up to 101 F on Friday. She has continued to feel ill through the weekend, but is no longer runnign fever. She has been using Alka-Seltzer Plus for symptomatic relief.  Past Medical History: Patient Active Problem List   Diagnosis Date Noted   Exercise-induced asthma 05/30/2021   Depression with anxiety 05/30/2021   Seasonal allergic rhinitis 05/30/2021   Muscle ache 05/30/2021   BRCA2 gene mutation positive 08/09/2018   Family history of breast cancer    Family history of BRCA2 gene positive    Common migraine with intractable migraine 09/27/2017   Past Surgical History:  Procedure Laterality Date   BREAST REDUCTION SURGERY Bilateral 03/28/2022   Procedure: MAMMARY REDUCTION  (BREAST);  Surgeon: Irene Limbo, MD;  Location: [facility];  Service: Plastics;  Laterality: Bilateral;   TONSILLECTOMY     Family History  Problem Relation Age of Onset   BRCA 1/2 Mother        BRCA2 pos   Migraines Father    BRCA 1/2 Maternal Aunt        BRCA2 pos   Diabetes Paternal Uncle    Breast cancer Maternal Grandmother    Diabetes Maternal Grandfather    Depression Paternal Grandmother    Heart failure Paternal Grandmother    Crohn's disease Paternal Grandmother    CVA Paternal Grandmother    Other Paternal Grandmother         CREST    Sjogren's syndrome Paternal Grandmother    Sjogren's syndrome Paternal Great-grandmother    Heart attack Maternal Great-grandmother 55   Hypertension Maternal Great-grandmother    Bladder Cancer Maternal Great-grandmother    Breast cancer Other    Outpatient Medications Prior to Visit  Medication Sig Dispense Refill   albuterol (VENTOLIN HFA) 108 (90 Base) MCG/ACT inhaler TAKE 2 PUFFS BY MOUTH EVERY 6 HOURS AS NEEDED FOR WHEEZE OR SHORTNESS OF BREATH 6.7 each 2   amphetamine-dextroamphetamine (ADDERALL) 15 MG tablet Take 0.5 tablets by mouth 2 (two) times daily.     BuPROPion HBr (APLENZIN) 348 MG TB24 Take 1 tablet by mouth daily.     Cetirizine HCl (ZYRTEC PO) Take 10 mg by mouth daily.      cloNIDine (CATAPRES) 0.1 MG tablet Take 0.1 mg by mouth daily.     doxepin (SINEQUAN) 25 MG capsule Take 100 mg by mouth daily.     EMGALITY 120 MG/ML SOAJ INJECT THE CONTENTS OF 1 PEN  SUBCUTANEOUSLY EVERY 28 DAYS 1 mL 11   etonogestrel (NEXPLANON) 68 MG IMPL implant 1 each by Subdermal route once.     fluticasone (FLONASE) 50 MCG/ACT nasal spray Place 2 sprays into both nostrils daily. 16 g 6   LORazepam (ATIVAN) 0.5 MG tablet Take 1 tablet (0.5 mg total) by mouth continuous as needed for anxiety. 5 tablet 0  rizatriptan (MAXALT) 10 MG tablet Take 10 mg by mouth as needed for migraine. May repeat in 2 hours if needed     valACYclovir (VALTREX) 1000 MG tablet SMARTSIG:2 Tablet(s) By Mouth Morning-Night     No facility-administered medications prior to visit.   Allergies  Allergen Reactions   Hydrocodone Swelling   Topamax [Topiramate]     Cognitive slowing     Objective:   Today's Vitals   11/20/22 1458  BP: 116/68  Pulse: 77  Temp: 98.7 F (37.1 C)  TempSrc: Temporal  SpO2: 97%  Weight: 225 lb 3.2 oz (102.2 kg)  Height: 5\' 6"  (1.676 m)   Body mass index is 36.35 kg/m.   General: Well developed, well nourished. No acute distress. HEENT: Normocephalic, non-traumatic.  PERRL, EOMI. Conjunctiva clear. External ears normal. EAC and TMs   normal bilaterally. Nose clear without congestion or rhinorrhea. Mucous membranes moist. Oropharynx clear. Good   dentition. Neck: Supple. No lymphadenopathy. No thyromegaly. Lungs: Clear to auscultation bilaterally. No wheezing, rales or rhonchi. Psych: Alert and oriented. Normal mood and affect.  Health Maintenance Due  Topic Date Due   COVID-19 Vaccine (5 - 2023-24 season) 09/01/2022   Lab Results POCT Covid: Pos. POCT Influenza A& B: Neg.  Assessment & Plan:   Problem List Items Addressed This Visit       Other   COVID-19 - Primary    Reviewed home care instructions for COVID, including rest, pushing fluids, and OTC medications as needed for symptom relief. Recommend hot tea with honey for sore throat symptoms. Advised self-isolation at home until 24 hours after fever resolved and symptoms improve. Continue to wear a mask around others for an additional 5 days. If symptoms, esp, dyspnea develops/worsens, recommend in-person evaluation at either an urgent care or the emergency room.       Relevant Orders   POC COVID-19 (Completed)   POCT Influenza A/B (Completed)    Return if symptoms worsen or fail to improve.   Haydee Salter, MD

## 2022-11-20 NOTE — Assessment & Plan Note (Signed)
Reviewed home care instructions for COVID, including rest, pushing fluids, and OTC medications as needed for symptom relief. Recommend hot tea with honey for sore throat symptoms. Advised self-isolation at home until 24 hours after fever resolved and symptoms improve. Continue to wear a mask around others for an additional 5 days. If symptoms, esp, dyspnea develops/worsens, recommend in-person evaluation at either an urgent care or the emergency room.

## 2023-01-30 ENCOUNTER — Ambulatory Visit: Payer: 59 | Admitting: Family Medicine

## 2023-04-24 ENCOUNTER — Ambulatory Visit (INDEPENDENT_AMBULATORY_CARE_PROVIDER_SITE_OTHER): Payer: 59 | Admitting: Family Medicine

## 2023-04-24 ENCOUNTER — Encounter: Payer: Self-pay | Admitting: Family Medicine

## 2023-04-24 VITALS — BP 118/74 | HR 55 | Temp 98.4°F | Ht 66.0 in | Wt 226.6 lb

## 2023-04-24 DIAGNOSIS — B009 Herpesviral infection, unspecified: Secondary | ICD-10-CM

## 2023-04-24 DIAGNOSIS — Z1501 Genetic susceptibility to malignant neoplasm of breast: Secondary | ICD-10-CM

## 2023-04-24 DIAGNOSIS — Z0000 Encounter for general adult medical examination without abnormal findings: Secondary | ICD-10-CM | POA: Diagnosis not present

## 2023-04-24 DIAGNOSIS — K219 Gastro-esophageal reflux disease without esophagitis: Secondary | ICD-10-CM | POA: Diagnosis not present

## 2023-04-24 DIAGNOSIS — Z131 Encounter for screening for diabetes mellitus: Secondary | ICD-10-CM | POA: Diagnosis not present

## 2023-04-24 DIAGNOSIS — Z1509 Genetic susceptibility to other malignant neoplasm: Secondary | ICD-10-CM

## 2023-04-24 DIAGNOSIS — Z23 Encounter for immunization: Secondary | ICD-10-CM

## 2023-04-24 LAB — GLUCOSE, RANDOM: Glucose, Bld: 96 mg/dL (ref 70–99)

## 2023-04-24 LAB — HEMOGLOBIN A1C: Hgb A1c MFr Bld: 5.6 % (ref 4.6–6.5)

## 2023-04-24 MED ORDER — VALACYCLOVIR HCL 1 G PO TABS: 1000.0000 mg | ORAL_TABLET | Freq: Two times a day (BID) | ORAL | 5 refills | Status: DC

## 2023-04-24 NOTE — Addendum Note (Signed)
Addended by: Loyola Mast on: 04/24/2023 11:12 AM   Modules accepted: Level of Service

## 2023-04-24 NOTE — Progress Notes (Signed)
[facility] [address] [place] [address] Dept: [phone] Dept Fax: [phone]  Annual Physical Visit  Subjective:    Patient ID: Bethany Robinson, female    DOB: 1991-01-26, 32 y.o..   MRN: [id number]  Chief Complaint  Patient presents with   Annual Exam    CPE/labs.    History of Present Illness:  Patient is in today for an annual physical/preventative visit.  Ms. Groene has a history of depression with anxiety. She continues to be seen at  [facility] by Hazel Sams (Psych NP). She is managed on clonidine 0.1 mg daily, doxepin 25 mg daily, and bupropion HBr 348 mg daily. She feels she is doing better emotionally. She has finished college. She continues to work for [employer], but anticipates a job change by next year.   Ms. Caughey has a history of a BRCA2 gene variant and multiple family members with breast cancer. She is scheduled for bilateral mastectomy with a DIEP flap in Oct. This will occur in [place], so she can convalesce with her parents.  Review of Systems  Constitutional:  Negative for chills, diaphoresis, fever, malaise/fatigue and weight loss.  HENT:  Negative for congestion, ear pain, hearing loss, sinus pain, sore throat and tinnitus.   Eyes:  Negative for blurred vision, pain, discharge and redness.  Respiratory:  Negative for cough, shortness of breath and wheezing.   Cardiovascular:  Negative for chest pain and palpitations.  Gastrointestinal:  Positive for heartburn. Negative for abdominal pain, constipation, diarrhea, nausea and vomiting.       Takes an antacid 3-4 times a week for heartburn.  Musculoskeletal:  Negative for back pain, joint pain and myalgias.  Skin:  Negative for itching and rash.  Psychiatric/Behavioral:  Negative for depression. The patient is not nervous/anxious.    Past Medical History: Patient Active Problem List   Diagnosis Date Noted    Exercise-induced asthma 05/30/2021   Depression with anxiety 05/30/2021   Seasonal allergic rhinitis 05/30/2021   BRCA2 gene mutation positive 08/09/2018   Family history of breast cancer    Family history of BRCA2 gene positive    Common migraine with intractable migraine 09/27/2017   Past Surgical History:  Procedure Laterality Date   BREAST REDUCTION SURGERY Bilateral 03/28/2022   Procedure: MAMMARY REDUCTION  (BREAST);  Surgeon: Glenna Fellows, MD;  Location: [facility];  Service: Plastics;  Laterality: Bilateral;   TONSILLECTOMY     Family History  Problem Relation Age of Onset   BRCA 1/2 Mother        BRCA2 pos   Migraines Father    BRCA 1/2 Maternal Aunt        BRCA2 pos   Diabetes Paternal Uncle    Breast cancer Maternal Grandmother    Diabetes Maternal Grandfather    Depression Paternal Grandmother    Heart failure Paternal Grandmother    Crohn's disease Paternal Grandmother    CVA Paternal Grandmother    Other Paternal Grandmother        CREST    Sjogren's syndrome Paternal Grandmother    Sjogren's syndrome Paternal Great-grandmother    Heart attack Maternal Great-grandmother 65   Hypertension Maternal Great-grandmother    Bladder Cancer Maternal Great-grandmother    Breast cancer Other    Outpatient Medications Prior to Visit  Medication Sig Dispense Refill   albuterol (VENTOLIN HFA) 108 (90 Base) MCG/ACT inhaler TAKE 2 PUFFS BY MOUTH EVERY 6 HOURS AS NEEDED FOR WHEEZE  OR SHORTNESS OF BREATH 6.7 each 2   amphetamine-dextroamphetamine (ADDERALL) 10 MG tablet Take 10 mg by mouth 2 (two) times daily.     BuPROPion HBr (APLENZIN) 348 MG TB24 Take 1 tablet by mouth daily.     Cetirizine HCl (ZYRTEC PO) Take 10 mg by mouth daily.      cloNIDine (CATAPRES) 0.1 MG tablet Take 0.1 mg by mouth daily.     doxepin (SINEQUAN) 25 MG capsule Take 100 mg by mouth daily.     EMGALITY 120 MG/ML SOAJ INJECT THE CONTENTS OF 1 PEN  SUBCUTANEOUSLY EVERY 28 DAYS 1 mL  11   etonogestrel (NEXPLANON) 68 MG IMPL implant 1 each by Subdermal route once.     fluticasone (FLONASE) 50 MCG/ACT nasal spray Place 2 sprays into both nostrils daily. 16 g 6   LORazepam (ATIVAN) 0.5 MG tablet Take 1 tablet (0.5 mg total) by mouth continuous as needed for anxiety. 5 tablet 0   rizatriptan (MAXALT) 10 MG tablet Take 10 mg by mouth as needed for migraine. May repeat in 2 hours if needed     valACYclovir (VALTREX) 1000 MG tablet SMARTSIG:2 Tablet(s) By Mouth Morning-Night     amphetamine-dextroamphetamine (ADDERALL) 15 MG tablet Take 0.5 tablets by mouth 2 (two) times daily.     No facility-administered medications prior to visit.   Allergies  Allergen Reactions   Hydrocodone Swelling   Topamax [Topiramate]     Cognitive slowing   Objective:   Today's Vitals   04/24/23 0909  BP: 118/74  Pulse: (!) 55  Temp: 98.4 F (36.9 C)  TempSrc: Temporal  SpO2: 100%  Weight: 226 lb 9.6 oz (102.8 kg)  Height: 5\' 6"  (1.676 m)   Body mass index is 36.57 kg/m.   General: Well developed, well nourished. No acute distress. HEENT: Normocephalic, non-traumatic. PERRL, EOMI. Conjunctiva clear. External ears normal. EAC and TMs   normal bilaterally. Nose clear without congestion or rhinorrhea. Mucous membranes moist. There eis mild to   moderate mucous streaking of the posterior oropharynx. Good dentition. Neck: Supple. No lymphadenopathy. No thyromegaly. Lungs: Clear to auscultation bilaterally. No wheezing, rales or rhonchi. CV: RRR without murmurs or rubs. Pulses 2+ bilaterally. Abdomen: Soft, non-tender. Bowel sounds positive, normal pitch and frequency. No hepatosplenomegaly. No   rebound or guarding. Extremities: Full ROM. No joint swelling or tenderness. No edema noted. Skin: Warm and dry. No rashes. Psych: Alert and oriented. Normal mood and affect.  Health Maintenance Due  Topic Date Due   INFLUENZA VACCINE  03/29/2023     Assessment & Plan:   Problem List Items  Addressed This Visit       Digestive   Gastroesophageal reflux disease without esophagitis    Recommend she consider using Pepcid for heartburn management.        Other   BRCA2 gene mutation positive    Scheduled for bilateral mastectomy with DIEP flap for reconstruction.      HSV-1 infection    Stable. Continue valacyclovir as needed for outbreaks.      Relevant Medications   valACYclovir (VALTREX) 1000 MG tablet   Other Visit Diagnoses     Annual physical exam    -  Primary   Overall good health. Recommend regular physcial activity for CV health. Reviewed recommended screenings and immunizations.   Screening for diabetes mellitus (DM)       Relevant Orders   Glucose, random   Hemoglobin A1c   Need for influenza vaccination  Relevant Orders   Flu Vaccine QUAD 6+ mos PF IM (Fluarix Quad PF) (Completed)       Return in about 4 months (around 08/24/2023) for Reassessment.   Loyola Mast, MD

## 2023-04-24 NOTE — Assessment & Plan Note (Signed)
Stable  Pt states has outbreaks of both oral and genital herpes about 1x/ month.   Continue valacyclovir as needed for outbreaks

## 2023-04-24 NOTE — Assessment & Plan Note (Signed)
Scheduled for bilateral mastectomy with DIEP flap for reconstruction.

## 2023-04-24 NOTE — Assessment & Plan Note (Signed)
Recommend she consider using Pepcid for heartburn management.

## 2023-05-11 NOTE — Addendum Note (Signed)
Addended by: Waymond Cera on: 05/11/2023 01:03 PM   Modules accepted: Orders

## 2023-07-10 ENCOUNTER — Telehealth: Payer: Self-pay

## 2023-07-10 NOTE — Telephone Encounter (Signed)
Former ML pt LVM in triage line stating she would like to schedule her yearly PUS.  Pt is BRCA 2+, Fx Hx of Ovarian Cancer  Last AEX 01/17/2022-ML, recall for 2024 sent per EMR (nothing currently scheduled)  Last PUS 03/14/2022 Had CT abdomen/pelvis in 02/2023 (care everywhere)  Please advise.

## 2023-07-10 NOTE — Telephone Encounter (Signed)
Normal CT 02/2023, will order u/s at AEX visit.

## 2023-07-10 NOTE — Telephone Encounter (Signed)
Pt scheduled w/ EB on 09/07/2023. Routing to provider for final review and closing encounter.

## 2023-07-10 NOTE — Telephone Encounter (Signed)
Pt notified and voiced understanding. Will go ahead and schedule AEX. Msg sent to appt desk.

## 2023-07-31 ENCOUNTER — Telehealth: Payer: Self-pay | Admitting: Pharmacy Technician

## 2023-07-31 NOTE — Telephone Encounter (Signed)
Pharmacy Patient Advocate Encounter   Received notification from CoverMyMeds that prior authorization for Emgality 120MG /ML auto-injectors is due for renewal.   Insurance verification completed.   The patient is insured through [facility] .   Per test claim: PA required; PA submitted to above mentioned insurance via CoverMyMeds Key/confirmation #/EOC [id number] Status is pending

## 2023-08-13 ENCOUNTER — Other Ambulatory Visit (HOSPITAL_COMMUNITY): Payer: Self-pay

## 2023-08-13 NOTE — Telephone Encounter (Signed)
Pharmacy Patient Advocate Encounter  Received notification from [facility] that Prior Authorization for Emgality INJ 120MG /ML has been APPROVED from 07/31/23 to 07/30/24  last filled 11/22 for a 3 month supply.   PA #/Case ID/Reference #:  [id number]

## 2023-08-24 ENCOUNTER — Ambulatory Visit: Payer: 59 | Admitting: Family Medicine

## 2023-08-27 ENCOUNTER — Ambulatory Visit (INDEPENDENT_AMBULATORY_CARE_PROVIDER_SITE_OTHER): Payer: 59 | Admitting: Family Medicine

## 2023-08-27 ENCOUNTER — Encounter: Payer: Self-pay | Admitting: Family Medicine

## 2023-08-27 VITALS — BP 118/82 | HR 90 | Temp 98.3°F | Ht 66.0 in | Wt 223.0 lb

## 2023-08-27 DIAGNOSIS — Z9013 Acquired absence of bilateral breasts and nipples: Secondary | ICD-10-CM | POA: Insufficient documentation

## 2023-08-27 DIAGNOSIS — F418 Other specified anxiety disorders: Secondary | ICD-10-CM

## 2023-08-27 NOTE — Assessment & Plan Note (Signed)
Managing well despite recent stressors. Continue clonidine 0.1 mg daily, doxepin 25 mg daily, and bupropion HBr 348 mg daily.

## 2023-08-27 NOTE — Progress Notes (Signed)
[facility] [address] [place] [address] Dept: [phone] Dept Fax: [phone]  Chronic Care Office Visit  Subjective:    Patient ID: Bethany Robinson, female    DOB: [birth year], 32 y.o..   MRN: [id number]  Chief Complaint  Patient presents with   Follow-up    4 month f/u.    History of Present Illness:  Patient is in today for reassessment of chronic medical issues.  Ms. Obarr has a history of depression with anxiety. She continues to be seen at [facility] by Hazel Sams (Psych NP). She is managed on clonidine 0.1 mg daily, doxepin 25 mg daily, and bupropion HBr 348 mg daily. She notes her recent surgery has been a stressor, but feels she is doing okay overall.   Ms. Erps has a history of a BRCA2 gene variant and multiple family members with breast cancer. She underwent bilateral nipple-sparing mastectomy with a DIEP flap in Oct. She spent 6 weeks convalescing with her parents in [place]. She notes her wounds are healing well.  Past Medical History: Patient Active Problem List   Diagnosis Date Noted   Gastroesophageal reflux disease without esophagitis 04/24/2023   HSV-1 infection 04/24/2023   Exercise-induced asthma 05/30/2021   Depression with anxiety 05/30/2021   Seasonal allergic rhinitis 05/30/2021   BRCA2 gene mutation positive 08/09/2018   Family history of breast cancer    Family history of BRCA2 gene positive    Common migraine with intractable migraine 09/27/2017   Past Surgical History:  Procedure Laterality Date   BREAST RECONSTRUCTION Bilateral    BREAST REDUCTION SURGERY Bilateral 03/28/2022   Procedure: MAMMARY REDUCTION  (BREAST);  Surgeon: Glenna Fellows, MD;  Location: [facility];  Service: Plastics;  Laterality: Bilateral;   NIPPLE SPARING MASTECTOMY Bilateral    TONSILLECTOMY     Family History  Problem Relation Age of Onset   BRCA 1/2 Mother         BRCA2 pos   Migraines Father    BRCA 1/2 Maternal Aunt        BRCA2 pos   Diabetes Paternal Uncle    Breast cancer Maternal Grandmother    Diabetes Maternal Grandfather    Depression Paternal Grandmother    Heart failure Paternal Grandmother    Crohn's disease Paternal Grandmother    CVA Paternal Grandmother    Other Paternal Grandmother        CREST    Sjogren's syndrome Paternal Grandmother    Sjogren's syndrome Paternal Great-grandmother    Heart attack Maternal Great-grandmother 35   Hypertension Maternal Great-grandmother    Bladder Cancer Maternal Great-grandmother    Breast cancer Other    Outpatient Medications Prior to Visit  Medication Sig Dispense Refill   albuterol (VENTOLIN HFA) 108 (90 Base) MCG/ACT inhaler TAKE 2 PUFFS BY MOUTH EVERY 6 HOURS AS NEEDED FOR WHEEZE OR SHORTNESS OF BREATH 6.7 each 2   amphetamine-dextroamphetamine (ADDERALL) 10 MG tablet Take 10 mg by mouth 2 (two) times daily.     BuPROPion HBr (APLENZIN) 348 MG TB24 Take 1 tablet by mouth daily.     Cetirizine HCl (ZYRTEC PO) Take 10 mg by mouth daily.      cloNIDine (CATAPRES) 0.1 MG tablet Take 0.1 mg by mouth daily.     doxepin (SINEQUAN) 25 MG capsule Take 100 mg by mouth daily.     EMGALITY 120 MG/ML SOAJ INJECT THE CONTENTS OF 1 PEN  SUBCUTANEOUSLY EVERY 28 DAYS 1 mL  11   etonogestrel (NEXPLANON) 68 MG IMPL implant 1 each by Subdermal route once.     famotidine (PEPCID) 20 MG tablet Take 20 mg by mouth as needed.     fluticasone (FLONASE) 50 MCG/ACT nasal spray Place 2 sprays into both nostrils daily. 16 g 6   LORazepam (ATIVAN) 0.5 MG tablet Take 1 tablet (0.5 mg total) by mouth continuous as needed for anxiety. 5 tablet 0   rizatriptan (MAXALT) 10 MG tablet Take 10 mg by mouth as needed for migraine. May repeat in 2 hours if needed     valACYclovir (VALTREX) 1000 MG tablet Take 1 tablet (1,000 mg total) by mouth 2 (two) times daily. 14 tablet 5   No facility-administered medications  prior to visit.   Allergies  Allergen Reactions   Hydrocodone Swelling   Topamax [Topiramate]     Cognitive slowing   Objective:   Today's Vitals   08/27/23 1310  BP: 118/82  Pulse: 90  Temp: 98.3 F (36.8 C)  TempSrc: Temporal  SpO2: 100%  Weight: 223 lb (101.2 kg)  Height: 5\' 6"  (1.676 m)   Body mass index is 35.99 kg/m.   General: Well developed, well nourished. No acute distress. Psych: Alert and oriented. Normal mood and affect.  There are no preventive care reminders to display for this patient.    Assessment & Plan:   Problem List Items Addressed This Visit       Other   Depression with anxiety   Managing well despite recent stressors. Continue clonidine 0.1 mg daily, doxepin 25 mg daily, and bupropion HBr 348 mg daily.      Status post bilateral mastectomy - Primary   Healing well.       Return in about 9 months (around 05/27/2024) for Annual preventative care.   Loyola Mast, MD

## 2023-08-27 NOTE — Assessment & Plan Note (Signed)
Healing well.

## 2023-09-07 ENCOUNTER — Other Ambulatory Visit: Payer: Self-pay | Admitting: Neurology

## 2023-09-07 ENCOUNTER — Ambulatory Visit: Payer: 59 | Admitting: Obstetrics and Gynecology

## 2023-09-09 NOTE — Progress Notes (Signed)
 NEUROLOGY FOLLOW UP OFFICE NOTE  Matsuko Kretz [id number]  Assessment/Plan:   1. Migraine without aura,without status migrainosus, not intractable 2.  Migraine with aura, without status migrainosus, not intractable 3.  Primary stabbing headache   1.  Headache prevention:  Emgality  2.  Headache rescue:  Ibuprofen or rizatriptan  10mg  3.  Limit use of pain relievers to no more than 2 days out of week to prevent risk of rebound or medication-overuse headache. 4.  Keep headache diary 5.  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    Rescue therapy:  Ibuprofen or naproxen , rarely rizatriptan  Current NSAIDS:  Aleve , ibuprofen Current analgesics:  Excedrin Current triptans:  Rizatriptan  10mg  Current ergotamine:  none Current anti-emetic:  none Current muscle relaxants:  none Current anti-anxiolytic:  none Current sleep aide:  trazodone Current Antihypertensive medications:  clonidine Current Antidepressant medications:  bupropion 348mg , doxepin  Current Anticonvulsant medications:  none Current anti-CGRP:  Emgality  Current Vitamins/Herbal/Supplements:  D, B12 (sometimes) Current Antihistamines/Decongestants:  Zyrtec Other therapy:  none Hormone/birth control:  Nexplanon     Caffeine:  occasional coffee.  No soda Alcohol:  Socially (2 to 3 drinks a week) Smoker:  no increased water intake.   Exercise:  trying to restart  Depression:  yes; Anxiety:  yes Other pain:  general Sleep hygiene:  suboptimal   HISTORY:  She has history of various neurologic symptoms since 2018, such as dizziness, numbness and tingling of hands feet, and legs, word-finding  difficulty/stuttering and fatigue.  She has tremors in hand and has trouble with dexterity and fine-finger movements.  Symptoms are intermittent.  She had workup performed by a previous neurologist, which was negative.  MRI of brain with and without contrast from 10/02/17 was normal.  Symptoms subsequently resolved.  On 04/13/19, she presented to the ED for full-body twitching and muscle spasms that started after ingesting marijuana-laced gummy bear, which she has eaten in the past without complication.  She was treated with IVF and Ativan .  Following this event, she started to experience her previous symptoms, such as fatigue, paresthesias and word-finding difficulty.  B12 316; TSH 2.380.  Headaches have gotten worse as well.   She has several headaches: She reports ice-pick headaches:  Usually right-temple but may vary in location.  They are severe stabbing pain.  However she has a constant non-throbbing pain in various locations (right sided, behind head or behind eye).   Her typical migraines, since middle school, are a severe right occipital throbbing headache associated photophobia, phonophobia, sometimes nausea but no vomiting, visual disturbance or unilateral numbness and weakness.  They typically last 2 hours and occur maybe 2 to 3 times month.  No specific triggers.  They are relieved with analgesics and sleep.   In early 2021, she started experiencing pain in all of her fingertips, described as a deep bruising pain, not a burning, stinging, or electric pain.  It often is aggravated when applying any degree of pressure on her fingertips.  No associated numbness or tingling.  She reports difficulty holding objects in her hands.  Sometimes her fingers turn white.  She has tension in her shoulders but no neck pain..  Symptoms somewhat improved after getting a neck  adjustment by a chiropractor but have not resolved.  She notes some mild tingling along the outside of her ankles and feet.  Rheumatologic  workup from January, including ANA, ds-DNA, Scl-70, sed rate 5, and CRP 3, were unremarkable.  Labs from August 2020 included TSH 2.380.  Labs included B12 576, vitamin D  19.8 (started supplement).  She did not tolerate Cymbalta .  I did not have an explanation for her symptoms.   Past NSAIDS:  Naproxen  500mg  Past analgesics:  Tylenol  Past abortive triptans:  Sumatriptan  50mg  Past abortive ergotamine:  none Past muscle relaxants:  none Past anti-emetic:  none Past antihypertensive medications:  none Past antidepressant medications:  venlafaxine  XR 75mg , Cymbalta  60mg  Past anticonvulsant medications:  topiramate  75mg  (somewhat helpful but cognitive deficits), gabapentin  100mg  in AM and 200mg  in PM Past anti-CGRP:  none Past vitamins/Herbal/Supplements:  none Past antihistamines/decongestants:  none Other past therapies:  none     Family history of headache:  Father (migraine with aura such as right sided body numbness) Other family history:  Paternal grandmother (CREST syndrome)  PAST MEDICAL HISTORY: Past Medical History:  Diagnosis Date   Anxiety    Common migraine with intractable migraine 09/27/2017   Depression    Family history of BRCA2 gene positive    Family history of breast cancer    HSV infection    oral    MEDICATIONS: Current Outpatient Medications on File Prior to Visit  Medication Sig Dispense Refill   albuterol  (VENTOLIN  HFA) 108 (90 Base) MCG/ACT inhaler TAKE 2 PUFFS BY MOUTH EVERY 6 HOURS AS NEEDED FOR WHEEZE OR SHORTNESS OF BREATH 6.7 each 2   amphetamine-dextroamphetamine (ADDERALL) 10 MG tablet Take 10 mg by mouth 2 (two) times daily.     BuPROPion HBr (APLENZIN) 348 MG TB24 Take 1 tablet by mouth daily.     Cetirizine HCl (ZYRTEC PO) Take 10 mg by mouth daily.      cloNIDine (CATAPRES) 0.1 MG tablet Take 0.1 mg by mouth daily.     doxepin  (SINEQUAN ) 25 MG capsule Take 100 mg by mouth daily.     EMGALITY  120 MG/ML SOAJ INJECT THE CONTENTS OF 1 PEN   SUBCUTANEOUSLY EVERY 28 DAYS 1 mL 11   etonogestrel  (NEXPLANON ) 68 MG IMPL implant 1 each by Subdermal route once.     famotidine (PEPCID) 20 MG tablet Take 20 mg by mouth as needed.     fluticasone  (FLONASE ) 50 MCG/ACT nasal spray Place 2 sprays into both nostrils daily. 16 g 6   LORazepam  (ATIVAN ) 0.5 MG tablet Take 1 tablet (0.5 mg total) by mouth continuous as needed for anxiety. 5 tablet 0   rizatriptan  (MAXALT ) 10 MG tablet Take 10 mg by mouth as needed for migraine. May repeat in 2 hours if needed     valACYclovir  (VALTREX ) 1000 MG tablet Take 1 tablet (1,000 mg total) by mouth 2 (two) times daily. 14 tablet 5   No current facility-administered medications on file prior to visit.    ALLERGIES: Allergies  Allergen Reactions   Hydrocodone Swelling   Topamax  [Topiramate ]     Cognitive slowing    FAMILY HISTORY: Family History  Problem Relation Age of Onset   BRCA 1/2 Mother        BRCA2 pos   Migraines Father    BRCA 1/2 Maternal Aunt        BRCA2 pos   Diabetes Paternal Uncle    Breast cancer Maternal Grandmother    Diabetes Maternal Grandfather  Depression Paternal Grandmother    Heart failure Paternal Grandmother    Crohn's disease Paternal Grandmother    CVA Paternal Grandmother    Other Paternal Grandmother        CREST    Sjogren's syndrome Paternal Grandmother    Sjogren's syndrome Paternal Great-grandmother    Heart attack Maternal Great-grandmother 46   Hypertension Maternal Great-grandmother    Bladder Cancer Maternal Great-grandmother    Breast cancer Other       Objective:  Blood pressure 110/72, pulse 60, resp. rate 18, height 5' 6 (1.676 m), SpO2 98%. General: No acute distress.  Patient appears well-groomed.   Head:  Normocephalic/atraumatic Eyes:  Fundi examined but not visualized Neck: supple, no paraspinal tenderness, full range of motion Heart:  Regular rate and rhythm Neurological Exam: alert and oriented.  Speech fluent and not  dysarthric, language intact.  CN II-XII intact. Bulk and tone normal, muscle strength 5/5 throughout.  Sensation to light touch intact.  Deep tendon reflexes 2+ throughout.  Finger to nose testing intact.  Gait normal, Romberg negative.   Juliene Dunnings, DO  CC: Garnette Simpler, MD

## 2023-09-10 ENCOUNTER — Ambulatory Visit: Payer: 59 | Admitting: Neurology

## 2023-09-10 ENCOUNTER — Encounter: Payer: Self-pay | Admitting: Neurology

## 2023-09-10 VITALS — BP 110/72 | HR 60 | Resp 18 | Ht 66.0 in

## 2023-09-10 DIAGNOSIS — G4485 Primary stabbing headache: Secondary | ICD-10-CM | POA: Diagnosis not present

## 2023-09-10 DIAGNOSIS — G43109 Migraine with aura, not intractable, without status migrainosus: Secondary | ICD-10-CM

## 2023-09-10 MED ORDER — RIZATRIPTAN BENZOATE 10 MG PO TABS: 10.0000 mg | ORAL_TABLET | ORAL | 5 refills | Status: DC | PRN

## 2023-09-10 MED ORDER — EMGALITY 120 MG/ML ~~LOC~~ SOAJ: SUBCUTANEOUS | 3 refills | Status: DC

## 2023-09-10 NOTE — Patient Instructions (Signed)
 Emgality monthly Ibuprofen/naproxen or rizatriptan as needed

## 2023-09-14 ENCOUNTER — Ambulatory Visit (INDEPENDENT_AMBULATORY_CARE_PROVIDER_SITE_OTHER): Payer: 59 | Admitting: Obstetrics and Gynecology

## 2023-09-14 ENCOUNTER — Encounter: Payer: Self-pay | Admitting: Obstetrics and Gynecology

## 2023-09-14 ENCOUNTER — Other Ambulatory Visit (HOSPITAL_COMMUNITY)
Admission: RE | Admit: 2023-09-14 | Discharge: 2023-09-14 | Disposition: A | Discharge status: Home / Self Care | Payer: 59 | Source: Ambulatory Visit | Attending: Obstetrics and Gynecology | Admitting: Obstetrics and Gynecology

## 2023-09-14 VITALS — BP 110/76 | HR 78 | Ht 65.75 in | Wt 218.0 lb

## 2023-09-14 DIAGNOSIS — Z1509 Genetic susceptibility to other malignant neoplasm: Secondary | ICD-10-CM | POA: Diagnosis not present

## 2023-09-14 DIAGNOSIS — Z01419 Encounter for gynecological examination (general) (routine) without abnormal findings: Secondary | ICD-10-CM | POA: Insufficient documentation

## 2023-09-14 DIAGNOSIS — Z1501 Genetic susceptibility to malignant neoplasm of breast: Secondary | ICD-10-CM | POA: Diagnosis not present

## 2023-09-14 DIAGNOSIS — Z1331 Encounter for screening for depression: Secondary | ICD-10-CM

## 2023-09-14 NOTE — Progress Notes (Signed)
33 y.o. y.o. female here for annual exam. No LMP recorded. Patient has had an implant.     G0P0000  Not interested in childbearing. Recent bilateral mastectomy with reconstructive surgery this past year.  Family lives in [place]  RP: [place] positive/Fam h/o Ovarian Ca for Pelvic US  HPI: Well on Nexplanon  x 01/20/22 with no pelvic pain, no BTB.  STI screen Negative 01/17/2022.  BrCa 2 Positive:  Ca 125 normal at 13.  Body mass index is 35.45 kg/m.     09/14/2023    9:29 AM 08/27/2023    1:18 PM 04/24/2023    9:16 AM  Depression screen PHQ 2/9  Decreased Interest 2 3 1   Down, Depressed, Hopeless 2 2 1   PHQ - 2 Score 4 5 2   Altered sleeping 1 1 3   Tired, decreased energy 3 3 3   Change in appetite 2 1 3   Feeling bad or failure about yourself  2 2 3   Trouble concentrating 1 1 0  Moving slowly or fidgety/restless 0 0 0  Suicidal thoughts 0 0 0  PHQ-9 Score 13 13 14   Difficult doing work/chores Somewhat difficult Somewhat difficult Somewhat difficult    Blood pressure 110/76, pulse 78, height 5' 5.75" (1.67 m), weight 218 lb (98.9 kg), SpO2 99%.     Component Value Date/Time   DIAGPAP  01/17/2022 1205    - Negative for intraepithelial lesion or malignancy (NILM)   DIAGPAP  10/04/2018 0000    NEGATIVE FOR INTRAEPITHELIAL LESIONS OR MALIGNANCY.   ADEQPAP  01/17/2022 1205    Satisfactory for evaluation; transformation zone component PRESENT.   ADEQPAP  10/04/2018 0000    Satisfactory for evaluation  endocervical/transformation zone component ABSENT.    GYN HISTORY:    Component Value Date/Time   DIAGPAP  01/17/2022 1205    - Negative for intraepithelial lesion or malignancy (NILM)   DIAGPAP  10/04/2018 0000    NEGATIVE FOR INTRAEPITHELIAL LESIONS OR MALIGNANCY.   ADEQPAP  01/17/2022 1205    Satisfactory for evaluation; transformation zone component PRESENT.   ADEQPAP  10/04/2018 0000    Satisfactory for evaluation  endocervical/transformation zone component ABSENT.     OB History  Gravida Para Term Preterm AB Living  0 0 0 0 0 0  SAB IAB Ectopic Multiple Live Births  0 0 0 0 0    Past Medical History:  Diagnosis Date   Anxiety    Common migraine with intractable migraine 09/27/2017   Depression    Family history of BRCA2 gene positive    Family history of breast cancer    HSV infection    oral    Past Surgical History:  Procedure Laterality Date   BREAST RECONSTRUCTION Bilateral    BREAST REDUCTION SURGERY Bilateral 03/28/2022   Procedure: MAMMARY REDUCTION  (BREAST);  Surgeon: Glenna Fellows, MD;  Location: [facility];  Service: Plastics;  Laterality: Bilateral;   NIPPLE SPARING MASTECTOMY Bilateral    TONSILLECTOMY      Current Outpatient Medications on File Prior to Visit  Medication Sig Dispense Refill   albuterol (VENTOLIN HFA) 108 (90 Base) MCG/ACT inhaler TAKE 2 PUFFS BY MOUTH EVERY 6 HOURS AS NEEDED FOR WHEEZE OR SHORTNESS OF BREATH 6.7 each 2   amphetamine-dextroamphetamine (ADDERALL) 10 MG tablet Take 10 mg by mouth 2 (two) times daily.     BuPROPion HBr (APLENZIN) 348 MG TB24 Take 1 tablet by mouth daily.     Cetirizine HCl (ZYRTEC PO) Take 10  mg by mouth daily.      cloNIDine (CATAPRES) 0.1 MG tablet Take 0.1 mg by mouth daily.     doxepin (SINEQUAN) 25 MG capsule Take 100 mg by mouth daily.     etonogestrel (NEXPLANON) 68 MG IMPL implant 1 each by Subdermal route once.     famotidine (PEPCID) 20 MG tablet Take 20 mg by mouth as needed.     fluticasone (FLONASE) 50 MCG/ACT nasal spray Place 2 sprays into both nostrils daily. 16 g 6   Galcanezumab-gnlm (EMGALITY) 120 MG/ML SOAJ INJECT THE CONTENTS OF 1 PEN  SUBCUTANEOUSLY EVERY 28 DAYS 3 mL 3   LORazepam (ATIVAN) 0.5 MG tablet Take 1 tablet (0.5 mg total) by mouth continuous as needed for anxiety. 5 tablet 0   rizatriptan (MAXALT) 10 MG tablet Take 1 tablet (10 mg total) by mouth as needed for migraine. May repeat in 2 hours if needed 10 tablet 5    valACYclovir (VALTREX) 1000 MG tablet Take 1 tablet (1,000 mg total) by mouth 2 (two) times daily. 14 tablet 5   No current facility-administered medications on file prior to visit.    Social History   Socioeconomic History   Marital status: Single    Spouse name: Not on file   Number of children: 0   Years of education: 39   Highest education level: Master's degree (e.g., MA, MS, MEng, MEd, MSW, MBA)  Occupational History   Occupation: cookout   Occupation: Audiological scientist    Comment: Radio broadcast assistant  Tobacco Use   Smoking status: Never   Smokeless tobacco: Never  Vaping Use   Vaping status: Never Used  Substance and Sexual Activity   Alcohol use: Yes    Comment: socially   Drug use: Not Currently   Sexual activity: Yes    Partners: Male    Birth control/protection: Implant  Other Topics Concern   Not on file  Social History Narrative   Right handed   Drinks caffeine   One floor home   Works at [employer] out   Social Drivers of Corporate investment banker Strain: Low Risk  (08/23/2023)   Overall Financial Resource Strain (CARDIA)    Difficulty of Paying Living Expenses: Not very hard  Food Insecurity: No Food Insecurity (08/23/2023)   Hunger Vital Sign    Worried About Running Out of Food in the Last Year: Never true    Ran Out of Food in the Last Year: Never true  Transportation Needs: No Transportation Needs (08/23/2023)   PRAPARE - Administrator, Civil Service (Medical): No    Lack of Transportation (Non-Medical): No  Physical Activity: Insufficiently Active (08/23/2023)   Exercise Vital Sign    Days of Exercise per Week: 1 day    Minutes of Exercise per Session: 10 min  Stress: Stress Concern Present (08/23/2023)   [employer] of Occupational Health - Occupational Stress Questionnaire    Feeling of Stress : To some extent  Social Connections: Socially Isolated (08/23/2023)   Social Connection and Isolation Panel [NHANES]    Frequency of Communication  with Friends and Family: Once a week    Frequency of Social Gatherings with Friends and Family: Never    Attends Religious Services: Never    Database administrator or Organizations: No    Attends Banker Meetings: Not on file    Marital Status: Never married  Intimate Partner Violence: Not At Risk (06/06/2023)   Received from [facility]  Interpersonal Safety Screen    Are you in a relationship where you have been physically or emotionally hurt or threatened? : No    Family History  Problem Relation Age of Onset   BRCA 1/2 Mother        BRCA2 pos   Migraines Father    BRCA 1/2 Maternal Aunt        BRCA2 pos   Diabetes Paternal Uncle    Breast cancer Maternal Grandmother    Diabetes Maternal Grandfather    Depression Paternal Grandmother    Heart failure Paternal Grandmother    Crohn's disease Paternal Grandmother    CVA Paternal Grandmother    Other Paternal Grandmother        CREST    Sjogren's syndrome Paternal Grandmother    Sjogren's syndrome Paternal Great-grandmother    Heart attack Maternal Great-grandmother 17   Hypertension Maternal Great-grandmother    Bladder Cancer Maternal Great-grandmother    Breast cancer Other      Allergies  Allergen Reactions   Hydrocodone Swelling   Topamax [Topiramate]     Cognitive slowing      Patient's last menstrual period was No LMP recorded. Patient has had an implant..           Review of Systems Alls systems reviewed and are negative.     Physical Exam Constitutional:      Appearance: Normal appearance.  Genitourinary:     Vulva and urethral meatus normal.     No lesions in the vagina.     Right Labia: No rash, lesions or skin changes.    Left Labia: No lesions, skin changes or rash.    No vaginal discharge or tenderness.     No vaginal prolapse present.    No vaginal atrophy present.     Right Adnexa: not tender, not palpable and no mass present.    Left Adnexa: not tender, not palpable  and no mass present.    No cervical motion tenderness or discharge.     Uterus is not enlarged, tender or irregular.  Breasts:    Right: Normal.     Left: Normal.  HENT:     Head: Normocephalic.  Neck:     Thyroid: No thyroid mass, thyromegaly or thyroid tenderness.  Cardiovascular:     Rate and Rhythm: Normal rate and regular rhythm.     Heart sounds: Normal heart sounds, S1 normal and S2 normal.  Pulmonary:     Effort: Pulmonary effort is normal.     Breath sounds: Normal breath sounds and air entry.  Chest:     Comments: B/l scar from mastectomy and reconstruction.  Paniculectomy used for reconstruction Abdominal:     General: There is no distension.     Palpations: Abdomen is soft. There is no mass.     Tenderness: There is no abdominal tenderness. There is no guarding or rebound.  Musculoskeletal:        General: Normal range of motion.     Cervical back: Full passive range of motion without pain, normal range of motion and neck supple. No tenderness.     Right lower leg: No edema.     Left lower leg: No edema.  Neurological:     Mental Status: She is alert.  Skin:    General: Skin is warm.  Psychiatric:        Mood and Affect: Mood normal.        Behavior: Behavior normal.  Thought Content: Thought content normal.  Vitals and nursing note reviewed. Exam conducted with a chaperone present.       A:         Well Woman GYN exam,brca2 positive carrier, nexplanon                              P:        Pap smear collected today Encouraged annual mammogram screening Colon cancer screening referral placed today along with dermatology consult for cancer screening risks with brca2 DXA not indicated Labs and immunizations ordered today Discussed breast self exams Encouraged healthy lifestyle practices Encouraged Vit D and Calcium   No follow-ups on file.  Earley Favor

## 2023-09-17 ENCOUNTER — Encounter: Payer: Self-pay | Admitting: Obstetrics and Gynecology

## 2023-09-17 DIAGNOSIS — R87610 Atypical squamous cells of undetermined significance on cytologic smear of cervix (ASC-US): Secondary | ICD-10-CM

## 2023-09-17 LAB — LIPID PANEL
Cholesterol: 142 mg/dL (ref <200)
HDL: 38 mg/dL — ABNORMAL LOW (ref >=50)
LDL Cholesterol (Calc): 84 mg/dL
Non-HDL Cholesterol (Calc): 104 mg/dL (ref <130)
Total CHOL/HDL Ratio: 3.7 (calc) (ref <5.0)
Triglycerides: 106 mg/dL (ref <150)

## 2023-09-17 LAB — COMPREHENSIVE METABOLIC PANEL
AG Ratio: 1.8 (calc) (ref 1.0–2.5)
ALT: 12 U/L (ref 6–29)
AST: 13 U/L (ref 10–30)
Albumin: 4.5 g/dL (ref 3.6–5.1)
Alkaline phosphatase (APISO): 67 U/L (ref 31–125)
BUN: 15 mg/dL (ref 7–25)
CO2: 22 mmol/L (ref 20–32)
Calcium: 9.3 mg/dL (ref 8.6–10.2)
Chloride: 106 mmol/L (ref 98–110)
Creat: 0.86 mg/dL (ref 0.50–0.97)
Globulin: 2.5 g/dL (ref 1.9–3.7)
Glucose, Bld: 84 mg/dL (ref 65–99)
Potassium: 4.2 mmol/L (ref 3.5–5.3)
Sodium: 138 mmol/L (ref 135–146)
Total Bilirubin: 0.6 mg/dL (ref 0.2–1.2)
Total Protein: 7 g/dL (ref 6.1–8.1)

## 2023-09-17 LAB — CBC
HCT: 44.2 % (ref 35.0–45.0)
Hemoglobin: 14.4 g/dL (ref 11.7–15.5)
MCH: 27.5 pg (ref 27.0–33.0)
MCHC: 32.6 g/dL (ref 32.0–36.0)
MCV: 84.4 fL (ref 80.0–100.0)
MPV: 12.6 fL — ABNORMAL HIGH (ref 7.5–12.5)
Platelets: 228 10*3/uL (ref 140–400)
RBC: 5.24 10*6/uL — ABNORMAL HIGH (ref 3.80–5.10)
RDW: 13 % (ref 11.0–15.0)
WBC: 6.1 10*3/uL (ref 3.8–10.8)

## 2023-09-17 LAB — TSH: TSH: 1.64 m[IU]/L

## 2023-09-17 LAB — HEMOGLOBIN A1C
Hgb A1c MFr Bld: 5.8 %{Hb} — ABNORMAL HIGH (ref <5.7)
Mean Plasma Glucose: 120 mg/dL
eAG (mmol/L): 6.6 mmol/L

## 2023-09-17 LAB — VITAMIN D 25 HYDROXY (VIT D DEFICIENCY, FRACTURES): Vit D, 25-Hydroxy: 28 ng/mL — ABNORMAL LOW (ref 30–100)

## 2023-09-17 LAB — CA 125: CA 125: 12 U/mL (ref <35)

## 2023-09-19 LAB — CYTOLOGY - PAP
Adequacy: ABSENT
Comment: NEGATIVE
Comment: NEGATIVE
Comment: NEGATIVE
Diagnosis: UNDETERMINED — AB
HPV 16: NEGATIVE
HPV 18 / 45: NEGATIVE
High risk HPV: POSITIVE — AB

## 2023-09-20 NOTE — Telephone Encounter (Signed)
Refer to result note for remaining documentation. Encounter signed.

## 2023-09-25 ENCOUNTER — Telehealth: Payer: 59 | Admitting: Family Medicine

## 2023-09-25 DIAGNOSIS — R3989 Other symptoms and signs involving the genitourinary system: Secondary | ICD-10-CM

## 2023-09-25 MED ORDER — NITROFURANTOIN MONOHYD MACRO 100 MG PO CAPS: 100.0000 mg | ORAL_CAPSULE | Freq: Two times a day (BID) | ORAL | 0 refills | Status: AC

## 2023-09-25 NOTE — Progress Notes (Signed)

## 2023-10-16 ENCOUNTER — Encounter: Payer: Self-pay | Admitting: Obstetrics and Gynecology

## 2023-10-16 NOTE — Telephone Encounter (Signed)
Spoke with patient. Menses started 10/15/23, now full flow. Nexplanon for contraceptive.   Recommended r/s colposcopy. Patient is currently scheduled for PUS on 10/30/23.   Colpo r/s to 11/05/23 at 0830 with Dr. Karma Greaser, arrive at 0815.   Routing to provider for final review. Patient is agreeable to disposition. Will close encounter.

## 2023-10-17 ENCOUNTER — Encounter: Payer: 59 | Admitting: Obstetrics and Gynecology

## 2023-10-30 ENCOUNTER — Ambulatory Visit (INDEPENDENT_AMBULATORY_CARE_PROVIDER_SITE_OTHER): Payer: 59 | Admitting: Obstetrics and Gynecology

## 2023-10-30 ENCOUNTER — Ambulatory Visit (INDEPENDENT_AMBULATORY_CARE_PROVIDER_SITE_OTHER): Payer: 59

## 2023-10-30 ENCOUNTER — Encounter: Payer: Self-pay | Admitting: Obstetrics and Gynecology

## 2023-10-30 VITALS — BP 110/80 | HR 97

## 2023-10-30 DIAGNOSIS — Z1509 Genetic susceptibility to other malignant neoplasm: Secondary | ICD-10-CM | POA: Diagnosis not present

## 2023-10-30 DIAGNOSIS — Z01419 Encounter for gynecological examination (general) (routine) without abnormal findings: Secondary | ICD-10-CM | POA: Diagnosis not present

## 2023-10-30 DIAGNOSIS — Z1501 Genetic susceptibility to malignant neoplasm of breast: Secondary | ICD-10-CM | POA: Diagnosis not present

## 2023-10-30 DIAGNOSIS — Z712 Person consulting for explanation of examination or test findings: Secondary | ICD-10-CM

## 2023-10-30 NOTE — Progress Notes (Signed)
 G0P0000  Not interested in childbearing. Recent bilateral mastectomy with reconstructive surgery this past year.  Family lives in [place]  RP: [place] positive/Fam h/o Ovarian Ca for Pelvic US  HPI: Well on Nexplanon  x 01/20/22 with no pelvic pain, no BTB.  STI screen Negative 01/17/2022.  BrCa 2 Positive:  Ca 125 normal at 13 and recently 12.  Body mass index is 35.45 kg/m.  Here today for PUS to evaluate the uterus and ovaries  Vag U/S No latex used Anteverted uterus Normal size and Shape No myometrial masses  Thin symmetrical endometrium No masses or thickening seen  Both ovaries normal size with normal follicle pattern  No adnexal masses No free fluid  BRCA2 positive presents for PUS Ca125 in normal range. Counseled on normal findings on [place] today. To repeat on yearly basis or sooner with any concerns 10 minutes spent on reviewing records, imaging,  and one on one patient time and counseling patient and documentation Dr. Karma Greaser

## 2023-11-05 ENCOUNTER — Other Ambulatory Visit (HOSPITAL_COMMUNITY)
Admission: RE | Admit: 2023-11-05 | Discharge: 2023-11-05 | Disposition: A | Discharge status: Home / Self Care | Source: Ambulatory Visit | Attending: Obstetrics and Gynecology | Admitting: Obstetrics and Gynecology

## 2023-11-05 ENCOUNTER — Encounter: Payer: Self-pay | Admitting: Obstetrics and Gynecology

## 2023-11-05 ENCOUNTER — Ambulatory Visit (INDEPENDENT_AMBULATORY_CARE_PROVIDER_SITE_OTHER): Payer: 59 | Admitting: Obstetrics and Gynecology

## 2023-11-05 VITALS — BP 130/86 | HR 87 | Ht 66.75 in | Wt 220.0 lb

## 2023-11-05 DIAGNOSIS — R87610 Atypical squamous cells of undetermined significance on cytologic smear of cervix (ASC-US): Secondary | ICD-10-CM

## 2023-11-05 DIAGNOSIS — R87810 Cervical high risk human papillomavirus (HPV) DNA test positive: Secondary | ICD-10-CM | POA: Diagnosis not present

## 2023-11-05 DIAGNOSIS — Z01812 Encounter for preprocedural laboratory examination: Secondary | ICD-10-CM

## 2023-11-05 LAB — PREGNANCY, URINE: Preg Test, Ur: NEGATIVE

## 2023-11-05 NOTE — Progress Notes (Signed)
 Colposcopy Procedure Note Bethany Robinson 11/05/2023  Indications:  Procedure Details  The risks and benefits of the procedure and Written informed consent obtained.  Speculum placed in vagina and excellent visualization of cervix achieved, cervix swabbed x 3 with acetic acid solution.  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

## 2023-11-05 NOTE — Addendum Note (Signed)
 Addended by: Earley Favor on: 11/05/2023 11:52 AM   Modules accepted: Orders

## 2023-11-07 ENCOUNTER — Encounter: Payer: Self-pay | Admitting: Obstetrics and Gynecology

## 2023-11-07 LAB — SURGICAL PATHOLOGY

## 2023-11-16 ENCOUNTER — Encounter: Payer: Self-pay | Admitting: Gastroenterology

## 2023-11-16 ENCOUNTER — Ambulatory Visit: Payer: 59 | Admitting: Gastroenterology

## 2023-11-16 VITALS — BP 110/82 | HR 77 | Ht 66.75 in | Wt 221.2 lb

## 2023-11-16 DIAGNOSIS — Z1509 Genetic susceptibility to other malignant neoplasm: Secondary | ICD-10-CM

## 2023-11-16 DIAGNOSIS — K5900 Constipation, unspecified: Secondary | ICD-10-CM

## 2023-11-16 DIAGNOSIS — K5909 Other constipation: Secondary | ICD-10-CM

## 2023-11-16 DIAGNOSIS — K219 Gastro-esophageal reflux disease without esophagitis: Secondary | ICD-10-CM

## 2023-11-16 DIAGNOSIS — Z1502 Genetic susceptibility to malignant neoplasm of ovary: Secondary | ICD-10-CM | POA: Diagnosis not present

## 2023-11-16 DIAGNOSIS — Z1501 Genetic susceptibility to malignant neoplasm of breast: Secondary | ICD-10-CM | POA: Diagnosis not present

## 2023-11-16 MED ORDER — SUFLAVE 178.7 G PO SOLR: 1.0000 | Freq: Once | ORAL | 0 refills | Status: AC

## 2023-11-16 NOTE — Progress Notes (Signed)
 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  She experiences chronic constipation, with bowel movements occurring only a couple of times a week, requiring straining. She has tried fiber supplements, which caused bloating, and Miralax, which was effective when used for a couple of days. She is not currently on any medication for constipation.  She experiences reflux several times a week, which worsens when she does not eat or is anxious. She uses Catering manager and occasionally takes Pepcid as needed. She occasionally uses ibuprofen for headaches or pain but not on a daily basis.  She has a BRCA2 gene mutation and underwent a bilateral mastectomy and deep reconstruction surgery in October. She is no longer on pain medication.  Family history is significant for breast cancer, with her grandmother having died from it and both her mother and aunt carrying the BRCA2 gene mutation.      Past Medical History:  Diagnosis Date   ADD (attention deficit disorder)    Anxiety    Common migraine with intractable migraine 09/27/2017   Depression    Family history of BRCA2 gene positive    Family history of breast cancer    HSV infection    oral    Past Surgical History:  Procedure Laterality Date   BREAST RECONSTRUCTION Bilateral    BREAST REDUCTION SURGERY Bilateral 03/28/2022   Procedure: MAMMARY REDUCTION  (BREAST);  Surgeon: Glenna Fellows, MD;  Location: [facility];  Service: Plastics;  Laterality: Bilateral;   NIPPLE SPARING MASTECTOMY Bilateral    TONSILLECTOMY     WISDOM TOOTH EXTRACTION      Current Outpatient Medications  Medication Sig  Dispense Refill   albuterol (VENTOLIN HFA) 108 (90 Base) MCG/ACT inhaler TAKE 2 PUFFS BY MOUTH EVERY 6 HOURS AS NEEDED FOR WHEEZE OR SHORTNESS OF BREATH 6.7 each 2   amphetamine-dextroamphetamine (ADDERALL) 10 MG tablet Take 10 mg by mouth 2 (two) times daily.     BuPROPion HBr (APLENZIN) 348 MG TB24 Take 1 tablet by mouth daily.     Cetirizine HCl (ZYRTEC PO) Take 10 mg by mouth daily.      cloNIDine (CATAPRES) 0.1 MG tablet Take 0.1 mg by mouth daily.     doxepin (SINEQUAN) 25 MG capsule Take 100 mg by mouth daily.     etonogestrel (NEXPLANON) 68 MG IMPL implant 1 each by Subdermal route once.     famotidine (PEPCID) 20 MG tablet Take 20 mg by mouth as needed.     fluticasone (FLONASE) 50 MCG/ACT nasal spray Place 2 sprays into both nostrils daily. 16 g 6   Galcanezumab-gnlm (EMGALITY) 120 MG/ML SOAJ INJECT THE CONTENTS OF 1 PEN  SUBCUTANEOUSLY EVERY 28 DAYS 3 mL 3   LORazepam (ATIVAN) 0.5 MG tablet Take 1 tablet (0.5 mg total) by mouth continuous as needed for anxiety. 5 tablet 0   PEG 3350-KCl-NaCl-NaSulf-MgSul (SUFLAVE) 178.7 g SOLR Take 1 kit by mouth once for 1 dose. 1 each 0   rizatriptan (MAXALT) 10 MG tablet Take 1 tablet (10 mg total) by mouth as needed for migraine. May repeat in 2 hours if needed 10 tablet 5   valACYclovir (VALTREX) 1000 MG tablet Take 1 tablet (1,000 mg total) by mouth  2 (two) times daily. 14 tablet 5   buPROPion (WELLBUTRIN SR) 150 MG 12 hr tablet Take 150 mg by mouth 2 (two) times daily. (Patient not taking: Reported on 11/16/2023)     No current facility-administered medications for this visit.    Allergies as of 11/16/2023 - Review Complete 11/16/2023  Allergen Reaction Noted   Hydrocodone Swelling 08/28/2018   Topamax [topiramate]  10/11/2017    Family History  Problem Relation Age of Onset   BRCA 1/2 Mother        BRCA2 pos   Migraines Father    BRCA 1/2 Maternal Aunt        BRCA2 pos   Diabetes Paternal Uncle    Breast cancer Maternal Grandmother     Diabetes Maternal Grandfather    Depression Paternal Grandmother    Heart failure Paternal Grandmother    Crohn's disease Paternal Grandmother    CVA Paternal Grandmother    Other Paternal Grandmother        CREST    Sjogren's syndrome Paternal Grandmother    Sjogren's syndrome Paternal Great-grandmother    Heart attack Maternal Great-grandmother 62   Hypertension Maternal Great-grandmother    Bladder Cancer Maternal Great-grandmother    Breast cancer Other     Social History   Socioeconomic History   Marital status: Single    Spouse name: Not on file   Number of children: 0   Years of education: 18   Highest education level: Master's degree (e.g., MA, MS, MEng, MEd, MSW, MBA)  Occupational History   Occupation: cookout   Occupation: Audiological scientist    Comment: Radio broadcast assistant  Tobacco Use   Smoking status: Never   Smokeless tobacco: Never  Advertising account planner   Vaping status: Never Used  Substance and Sexual Activity   Alcohol use: Yes    Comment: socially   Drug use: Not Currently   Sexual activity: Yes    Partners: Male    Birth control/protection: Implant  Other Topics Concern   Not on file  Social History Narrative   Right handed   Drinks caffeine   One floor home   Works at [employer] out   Social Drivers of Corporate investment banker Strain: Low Risk  (08/23/2023)   Overall Financial Resource Strain (CARDIA)    Difficulty of Paying Living Expenses: Not very hard  Food Insecurity: No Food Insecurity (08/23/2023)   Hunger Vital Sign    Worried About Running Out of Food in the Last Year: Never true    Ran Out of Food in the Last Year: Never true  Transportation Needs: No Transportation Needs (08/23/2023)   PRAPARE - Administrator, Civil Service (Medical): No    Lack of Transportation (Non-Medical): No  Physical Activity: Insufficiently Active (08/23/2023)   Exercise Vital Sign    Days of Exercise per Week: 1 day    Minutes of Exercise per Session: 10 min   Stress: Stress Concern Present (08/23/2023)   [employer] of Occupational Health - Occupational Stress Questionnaire    Feeling of Stress : To some extent  Social Connections: Socially Isolated (08/23/2023)   Social Connection and Isolation Panel [NHANES]    Frequency of Communication with Friends and Family: Once a week    Frequency of Social Gatherings with Friends and Family: Never    Attends Religious Services: Never    Database administrator or Organizations: No    Attends Banker Meetings: Not on file  Marital Status: Never married  Intimate Partner Violence: Not At Risk (06/06/2023)   Received from [facility]   Interpersonal Safety Screen    Are you in a relationship where you have been physically or emotionally hurt or threatened? : No    Review of Systems:    Constitutional: No weight loss, fever, chills, weakness or fatigue HEENT: Eyes: No change in vision               Ears, Nose, Throat:  No change in hearing or congestion Skin: No rash or itching Cardiovascular: No chest pain, chest pressure or palpitations   Respiratory: No SOB or cough Gastrointestinal: See HPI and otherwise negative Genitourinary: No dysuria or change in urinary frequency Neurological: No headache, dizziness or syncope Musculoskeletal: No new muscle or joint pain Hematologic: No bleeding or bruising Psychiatric: No history of depression or anxiety    Physical Exam:  Vital signs: BP 110/82 (BP Location: Right Arm, Patient Position: Sitting, Cuff Size: Large)   Pulse 77   Ht 5' 6.75" (1.695 m)   Wt 221 lb 3.2 oz (100.3 kg)   LMP 10/08/2023 (Approximate)   BMI 34.90 kg/m   Constitutional: NAD, Well developed, Well nourished, alert and cooperative Head:  Normocephalic and atraumatic. Eyes:   PEERL, EOMI. No icterus. Conjunctiva pink. Respiratory: Respirations even and unlabored. Lungs clear to auscultation bilaterally.   No wheezes, crackles, or rhonchi.   Cardiovascular:  Regular rate and rhythm. No peripheral edema, cyanosis or pallor.  Gastrointestinal:  Soft, nondistended, nontender. No rebound or guarding.  Hypoactive bowel sounds. No appreciable masses or hepatomegaly. Rectal:  Not performed.  Msk:  Symmetrical without gross deformities. Without edema, no deformity or joint abnormality.  Neurologic:  Alert and  oriented x4;  grossly normal neurologically.  Skin:   Dry and intact without significant lesions or rashes. Psychiatric: Oriented to person, place and time. Demonstrates good judgement and reason without abnormal affect or behaviors.   RELEVANT LABS AND IMAGING: CBC    Component Value Date/Time   WBC 6.1 09/14/2023 1009   RBC 5.24 (H) 09/14/2023 1009   HGB 14.4 09/14/2023 1009   HCT 44.2 09/14/2023 1009   PLT 228 09/14/2023 1009   MCV 84.4 09/14/2023 1009   MCH 27.5 09/14/2023 1009   MCHC 32.6 09/14/2023 1009   RDW 13.0 09/14/2023 1009   LYMPHSABS 1.3 04/13/2019 0151   MONOABS 0.5 04/13/2019 0151   EOSABS 0.1 04/13/2019 0151   BASOSABS 0.0 04/13/2019 0151    CMP     Component Value Date/Time   NA 138 09/14/2023 1009   NA 143 09/27/2017 0847   K 4.2 09/14/2023 1009   CL 106 09/14/2023 1009   CO2 22 09/14/2023 1009   GLUCOSE 84 09/14/2023 1009   BUN 15 09/14/2023 1009   BUN 13 09/27/2017 0847   CREATININE 0.86 09/14/2023 1009   CALCIUM 9.3 09/14/2023 1009   PROT 7.0 09/14/2023 1009   PROT 6.9 09/27/2017 0847   ALBUMIN 4.2 04/13/2019 0151   ALBUMIN 4.4 09/27/2017 0847   AST 13 09/14/2023 1009   ALT 12 09/14/2023 1009   ALKPHOS 62 04/13/2019 0151   BILITOT 0.6 09/14/2023 1009   BILITOT 0.5 09/27/2017 0847   GFRNONAA >60 04/13/2019 0151   GFRAA >60 04/13/2019 0151     Assessment/Plan:      Constipation Chronic constipation with infrequent bowel movements.  - Recommend regular use of MiraLAX, daily or every other day, with dosage adjusted based on symptoms.  GERD  Reflux symptoms exacerbated by  anxiety.  Controlled on as needed Pepcid  -- Can continue to use Pepcid as needed.  If worsening reflux please let us know. - Educated patient on lifestyle modifications and provided patient education handout  Colorectal Cancer Screening BRCA2 mutation and referred for early colonoscopy.  Denies family history of colon cancer.  Unsure if family history of colon polyps.  Accordingly to current guidelines, patient with BRCA 2 should still begin screening colonoscopies at age 30 (unless they have BRCA2 AND a family history of CRC or adenomatous polyps). However, patient would like colonoscopy. Out of abundance of precaution with positive genetic testing we can proceed for patient reassurance. - Schedule colonoscopy - I thoroughly discussed the procedure with the patient (at bedside) to include nature of the procedure, alternatives, benefits, and risks (including but not limited to bleeding, infection, perforation, anesthesia/cardiac pulmonary complications).  Patient verbalized understanding and gave verbal consent to proceed with procedure.      Lara Mulch [facility] 11/16/2023, 9:16 AM  Cc: Loyola Mast, MD

## 2023-11-16 NOTE — Patient Instructions (Addendum)
 Start taking Miralax 1 capful (17 grams) 1x / day for 1 week.   If this is not effective, increase to 1 dose 2x / day for 1 week.   If this is still not effective, increase to two capfuls (34 grams) 2x / day.   Can adjust dose as needed based on response. Can take 1/2 cap daily, skip days, or increase per day.    You have been scheduled for a colonoscopy. Please follow written instructions given to you at your visit today.   If you use inhalers (even only as needed), please bring them with you on the day of your procedure.  DO NOT TAKE 7 DAYS PRIOR TO TEST- Trulicity (dulaglutide) Ozempic, Wegovy (semaglutide) Mounjaro (tirzepatide) Bydureon Bcise (exanatide extended release)  DO NOT TAKE 1 DAY PRIOR TO YOUR TEST Rybelsus (semaglutide) Adlyxin (lixisenatide) Victoza (liraglutide) Byetta (exanatide) ___________________________________________________________________________  Bonita Quin will receive your bowel preparation through [facility], which ensures the lowest copay and home delivery, with outreach via text or call from an [phone] number. Please respond promptly to avoid rescheduling of your procedure. If you are interested in alternative options or have any questions regarding your prep, please contact them at [phone] ____________________________________________________________________________  Your Provider Has Sent Your Bowel Prep Regimen To [facility]   [facility] will contact you to verify your information and collect your copay, if applicable. Enjoy the comfort of your home while your prescription is mailed to you, FREE of any shipping charges.   [facility] accepts all major insurance benefits and applies discounts & coupons.  Have additional questions?   Chat: [email or web address] Call: [phone] Email: [email or web address] NCPDP: [id number]  How will [facility] contact you?  With a Welcome phone call,  a Welcome text and a checkout link in text form.  Texts  you receive from [phone] Are NOT Spam.  *To set up delivery, you must complete the checkout process via link or speak to one of the patient care representatives. If [facility] is unable to reach you, your prescription may be delayed.  To avoid long hold times on the phone, you may also utilize the secure chat feature on the [facility] website to request that they call you back for transaction completion or to expedite your concerns.   _______________________________________________________  If your blood pressure at your visit was 140/90 or greater, please contact your primary care physician to follow up on this.  _______________________________________________________  If you are age 81 or older, your body mass index should be between 23-30. Your Body mass index is 34.9 kg/m. If this is out of the aforementioned range listed, please consider follow up with your Primary Care Provider.  If you are age 58 or younger, your body mass index should be between 19-25. Your Body mass index is 34.9 kg/m. If this is out of the aformentioned range listed, please consider follow up with your Primary Care Provider.   ________________________________________________________  The [facility] providers would like to encourage you to use [facility] to communicate with providers for non-urgent requests or questions.  Due to long hold times on the telephone, sending your provider a message by [facility] may be a faster and more efficient way to get a response.  Please allow 48 business hours for a response.  Please remember that this is for non-urgent requests.  _______________________________________________________   It was a pleasure to see you today!  Thank you for trusting me with your gastrointestinal care!

## 2023-11-27 NOTE — Progress Notes (Signed)
 Agree with the assessment and plan as outlined by Boone Master, PA-C.  No clear increased risk for colon cancer in BRCA2.  There is increased risk for pancreatic cancer, particularly in patients with family history of pancreatic cancer and screening should be discussed in the future.  No family history of pancreatic cancer mentioned, but this should be clarified.

## 2023-11-30 ENCOUNTER — Encounter: Payer: Self-pay | Admitting: Gastroenterology

## 2023-12-03 ENCOUNTER — Telehealth: Payer: Self-pay | Admitting: Gastroenterology

## 2023-12-03 NOTE — Telephone Encounter (Signed)
 Good morning Dr. Tomasa Rand,   Patient wished to cancel 4/11 colonoscopy due to insurance not covering procedure and patient would have a high out of pocket cost.   Thank you.

## 2023-12-07 ENCOUNTER — Encounter: Admitting: Gastroenterology

## 2024-02-18 ENCOUNTER — Encounter: Payer: Self-pay | Admitting: Dermatology

## 2024-02-18 ENCOUNTER — Ambulatory Visit: Admitting: Dermatology

## 2024-02-18 DIAGNOSIS — L814 Other melanin hyperpigmentation: Secondary | ICD-10-CM

## 2024-02-18 DIAGNOSIS — L720 Epidermal cyst: Secondary | ICD-10-CM

## 2024-02-18 DIAGNOSIS — Z1283 Encounter for screening for malignant neoplasm of skin: Secondary | ICD-10-CM

## 2024-02-18 DIAGNOSIS — Z1501 Genetic susceptibility to malignant neoplasm of breast: Secondary | ICD-10-CM

## 2024-02-18 DIAGNOSIS — L905 Scar conditions and fibrosis of skin: Secondary | ICD-10-CM

## 2024-02-18 DIAGNOSIS — L821 Other seborrheic keratosis: Secondary | ICD-10-CM | POA: Diagnosis not present

## 2024-02-18 DIAGNOSIS — D1801 Hemangioma of skin and subcutaneous tissue: Secondary | ICD-10-CM

## 2024-02-18 NOTE — Patient Instructions (Signed)

## 2024-02-18 NOTE — Progress Notes (Signed)
   New Patient Visit   Subjective  Bethany Robinson is a 33 y.o. female who presents for the following: Skin Cancer Screening and Full Body Skin Exam.  No personal or family history of skin cancer. Patient reports that she does not spend much time outside, and is trying to improve her sunscreen routine.   Hx of BRCA Mutation, s/p mastectomy  The patient presents for Total-Body Skin Exam (TBSE) for skin cancer screening and mole check. The patient has spots, moles and lesions to be evaluated, some may be new or changing.  The following portions of the chart were reviewed this encounter and updated as appropriate: medications, allergies, medical history  Review of Systems:  No other skin or systemic complaints except as noted in HPI or Assessment and Plan.  Objective  Well appearing patient in no apparent distress; mood and affect are within normal limits.  A full examination was performed including scalp, head, eyes, ears, nose, lips, neck, chest, axillae, abdomen, back, buttocks, bilateral upper extremities, bilateral lower extremities, hands, feet, fingers, toes, fingernails, and toenails. All findings within normal limits unless otherwise noted below.   Relevant physical exam findings are noted in the Assessment and Plan.    Assessment & Plan   SKIN CANCER SCREENING PERFORMED TODAY.  LENTIGINES, SEBORRHEIC KERATOSES, HEMANGIOMAS - Benign normal skin lesions - Benign-appearing - Call for any changes  EPIDERMAL INCLUSION CYST Exam: Subcutaneous nodule at right abdomen  Benign-appearing. Exam most consistent with an epidermal inclusion cyst. Discussed that a cyst is a benign growth that can grow over time and sometimes get irritated or inflamed. Recommend observation if it is not bothersome. Discussed option of surgical excision to remove it if it is growing, symptomatic, or other changes noted. Please call for new or changing lesions so they can be evaluated.  Scar- from  mastectomy- chest  Return in about 2 years (around 02/17/2026) for Skin Exam.  I, Rollene Gobble, RN, am acting as scribe for RUFUS CHRISTELLA HOLY, MD .   Documentation: I have reviewed the above documentation for accuracy and completeness, and I agree with the above.  RUFUS CHRISTELLA HOLY, MD

## 2024-04-04 ENCOUNTER — Encounter: Payer: Self-pay | Admitting: Neurology

## 2024-04-09 ENCOUNTER — Other Ambulatory Visit (HOSPITAL_COMMUNITY): Payer: Self-pay

## 2024-04-09 ENCOUNTER — Telehealth: Payer: Self-pay

## 2024-04-09 NOTE — Telephone Encounter (Signed)
 PA request has been Submitted. New Encounter has been or will be created for follow up. For additional info see Pharmacy Prior Auth telephone encounter from 04-09-2024.

## 2024-04-09 NOTE — Telephone Encounter (Signed)
 Pharmacy Patient Advocate Encounter   Received notification from Pt Calls Messages that prior authorization for Emgality  120MG /ML auto-injectors (migraine) is required/requested.   Insurance verification completed.   The patient is insured through [facility] .   Per test claim: PA required; PA submitted to above mentioned insurance via Latent Key/confirmation #/EOC [facility] Status is pending

## 2024-04-11 ENCOUNTER — Telehealth: Payer: Self-pay | Admitting: Pharmacist

## 2024-04-11 NOTE — Telephone Encounter (Signed)
 Information has been sent to clinical pharmacist for appeals review. It may take 5-7 days to prepare the necessary documentation to request the appeal from the insurance.

## 2024-04-11 NOTE — Telephone Encounter (Signed)
 Appeal has been submitted for Emgality . Will advise when response is received, please be advised that most companies may take 30 days to make a decision. Appeal letter and supporting documentation have been faxed to [phone] on 04/11/2024 @12 :42 pm.  Thank you, Devere Pandy, PharmD Clinical Pharmacist  [place]  Direct Dial: [phone]

## 2024-04-23 ENCOUNTER — Other Ambulatory Visit (HOSPITAL_COMMUNITY): Payer: Self-pay

## 2024-04-29 ENCOUNTER — Other Ambulatory Visit (HOSPITAL_COMMUNITY): Payer: Self-pay

## 2024-05-06 ENCOUNTER — Other Ambulatory Visit (HOSPITAL_COMMUNITY): Payer: Self-pay

## 2024-05-07 ENCOUNTER — Encounter: Payer: Self-pay | Admitting: Obstetrics and Gynecology

## 2024-05-07 ENCOUNTER — Ambulatory Visit: Admitting: Obstetrics and Gynecology

## 2024-05-07 ENCOUNTER — Other Ambulatory Visit (HOSPITAL_COMMUNITY)
Admission: RE | Admit: 2024-05-07 | Discharge: 2024-05-07 | Disposition: A | Discharge status: Home / Self Care | Source: Ambulatory Visit | Attending: Obstetrics and Gynecology | Admitting: Obstetrics and Gynecology

## 2024-05-07 VITALS — BP 102/60 | HR 70 | Wt 225.2 lb

## 2024-05-07 DIAGNOSIS — Z124 Encounter for screening for malignant neoplasm of cervix: Secondary | ICD-10-CM

## 2024-05-07 DIAGNOSIS — R87610 Atypical squamous cells of undetermined significance on cytologic smear of cervix (ASC-US): Secondary | ICD-10-CM | POA: Insufficient documentation

## 2024-05-07 DIAGNOSIS — R87618 Other abnormal cytological findings on specimens from cervix uteri: Secondary | ICD-10-CM | POA: Diagnosis present

## 2024-05-07 NOTE — Progress Notes (Signed)
   Acute Office Visit  Subjective:    Patient ID: Bethany Robinson, female    DOB: 01-Dec-1990, 33 y.o.   MRN: [id number]   HPI 33 y.o. presents today for Gynecologic Exam (Repeat pap) .repeat pap smear   No LMP recorded (lmp unknown). Patient has had an implant.  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 To notify patient of the results and POC.  Dr. Glennon Norris MARLA Glennon

## 2024-05-09 ENCOUNTER — Ambulatory Visit: Payer: Self-pay | Admitting: Obstetrics and Gynecology

## 2024-05-09 LAB — CYTOLOGY - PAP
Comment: NEGATIVE
Diagnosis: NEGATIVE
Diagnosis: REACTIVE
High risk HPV: NEGATIVE

## 2024-05-19 ENCOUNTER — Other Ambulatory Visit (HOSPITAL_COMMUNITY): Payer: Self-pay

## 2024-05-23 ENCOUNTER — Other Ambulatory Visit (HOSPITAL_COMMUNITY): Payer: Self-pay

## 2024-05-23 NOTE — Telephone Encounter (Signed)
 Appeal has been approved through 04/12/2025.  Pharmacy notified and patient filled medication.

## 2024-08-19 ENCOUNTER — Other Ambulatory Visit: Payer: Self-pay | Admitting: Neurology

## 2024-09-05 ENCOUNTER — Ambulatory Visit: Admitting: Family Medicine

## 2024-09-08 NOTE — Progress Notes (Unsigned)
 "  NEUROLOGY FOLLOW UP OFFICE NOTE  Bethany Robinson [id number]  Assessment/Plan:   1. Migraine without aura,without status migrainosus, not intractable 2.  Migraine with aura, without status migrainosus, not intractable 3.  Primary stabbing headache 4.  Eye symptoms - unclear etiology   Offered to prescribe prednisone  taper but patient declines, states headache mild and improving. Migraine prevention:  Emgality   Migraine rescue:  ibuprofen or rizatriptan  10mg   Lifestyle modification: Limit use of pain relievers to no more than 9 days out of the month to prevent risk of rebound or medication-overuse headache. 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 Takes Aleve  to take the edge off.  Low severity (3/10).  She thinks it is getting better.   Normally Intensity:  2-3/10 Duration:  2 hours with rizatriptan /ibuprofen Frequency: 1 a month, usually last week before injection  Primary stabbing headaches are infrequent.  May have a couple a month and then several months without one.  Lasts less than a minute.    Last night, she was sitting in bed scrolling on her phone when her eyes felt like her eyes were moving side to side in synch with her pulse.  The words on the screen were jumping a little.  She stopped reading and it continued for about 30 minutes.  It may have happened once or twice before.    Rescue therapy:  Ibuprofen or naproxen , rarely rizatriptan  Current NSAIDS:  Aleve , ibuprofen Current  analgesics:  Excedrin Current triptans:  Rizatriptan  10mg  Current ergotamine:  none Current anti-emetic:  none Current muscle relaxants:  none Current anti-anxiolytic:  none Current sleep aide:  trazodone Current Antihypertensive medications:  clonidine Current Antidepressant medications:  bupropion 348mg , doxepin  Current Anticonvulsant medications:  none Current anti-CGRP:  Emgality  Current Vitamins/Herbal/Supplements:  D, B12 (sometimes) Current Antihistamines/Decongestants:  Zyrtec Other therapy:  none Hormone/birth control:  Nexplanon     Caffeine:  occasional coffee.  No soda Alcohol:  Socially (2 to 3 drinks a week) Smoker:  no increased water intake.   Exercise:  trying to restart  Depression:  yes; Anxiety:  yes Other pain:  general Sleep hygiene:  suboptimal   HISTORY:  She has history of various neurologic symptoms since 2018, such as dizziness, numbness and tingling of hands feet, and legs, word-finding difficulty/stuttering and fatigue.  She has tremors in hand and has trouble with dexterity and fine-finger movements.  Symptoms are intermittent.  She had workup performed by a previous neurologist, which was negative.  MRI of brain with and without contrast from 10/02/17 was normal.  Symptoms subsequently resolved.  On 04/13/19, she presented to the ED for full-body twitching and muscle spasms that started after ingesting marijuana-laced gummy bear, which she has eaten in the past without complication.  She was treated with IVF and Ativan .  Following this event, she started to experience her previous symptoms, such as fatigue, paresthesias and word-finding difficulty.  B12 316; TSH 2.380.  Headaches have gotten worse as well.   She has several headaches: She reports ice-pick headaches:  Usually right-temple but may vary in location.  They  are severe stabbing pain.  However she has a constant non-throbbing pain in various locations (right sided, behind head or behind eye).   Her  typical migraines, since middle school, are a severe right occipital throbbing headache associated photophobia, phonophobia, sometimes nausea but no vomiting, visual disturbance or unilateral numbness and weakness.  They typically last 2 hours and occur maybe 2 to 3 times month.  No specific triggers.  They are relieved with analgesics and sleep.   In early 2021, she started experiencing pain in all of her fingertips, described as a deep bruising pain, not a burning, stinging, or electric pain.  It often is aggravated when applying any degree of pressure on her fingertips.  No associated numbness or tingling.  She reports difficulty holding objects in her hands.  Sometimes her fingers turn white.  She has tension in her shoulders but no neck pain..  Symptoms somewhat improved after getting a neck adjustment by a chiropractor but have not resolved.  She notes some mild tingling along the outside of her ankles and feet.  Rheumatologic workup from January, including ANA, ds-DNA, Scl-70, sed rate 5, and CRP 3, were unremarkable.  Labs from August 2020 included TSH 2.380.  Labs included B12 576, vitamin D  19.8 (started supplement).  She did not tolerate Cymbalta .  I did not have an explanation for her symptoms.   Past NSAIDS:  Naproxen  500mg  Past analgesics:  Tylenol  Past abortive triptans:  Sumatriptan  50mg  Past abortive ergotamine:  none Past muscle relaxants:  none Past anti-emetic:  none Past antihypertensive medications:  none Past antidepressant medications:  venlafaxine  XR 75mg , Cymbalta  60mg  Past anticonvulsant medications:  topiramate  75mg  (somewhat helpful but cognitive deficits), gabapentin  100mg  in AM and 200mg  in PM Past anti-CGRP:  none Past vitamins/Herbal/Supplements:  none Past antihistamines/decongestants:  none Other past therapies:  none     Family history of headache:  Father (migraine with aura such as right sided body numbness) Other family history:  Paternal grandmother  (CREST syndrome)  PAST MEDICAL HISTORY: Past Medical History:  Diagnosis Date   ADD (attention deficit disorder)    Anxiety    Common migraine with intractable migraine 09/27/2017   Depression    Family history of BRCA2 gene positive    Family history of breast cancer    HSV infection    oral    MEDICATIONS: Current Outpatient Medications on File Prior to Visit  Medication Sig Dispense Refill   albuterol  (VENTOLIN  HFA) 108 (90 Base) MCG/ACT inhaler TAKE 2 PUFFS BY MOUTH EVERY 6 HOURS AS NEEDED FOR WHEEZE OR SHORTNESS OF BREATH 6.7 each 2   amphetamine-dextroamphetamine (ADDERALL) 10 MG tablet Take 10 mg by mouth 2 (two) times daily.     buPROPion (WELLBUTRIN SR) 150 MG 12 hr tablet Take 150 mg by mouth 2 (two) times daily.     Cetirizine HCl (ZYRTEC PO) Take 10 mg by mouth daily.      cloNIDine (CATAPRES) 0.1 MG tablet Take 0.1 mg by mouth daily.     doxepin  (SINEQUAN ) 25 MG capsule Take 100 mg by mouth daily.     etonogestrel  (NEXPLANON ) 68 MG IMPL implant 1 each by Subdermal route once.     famotidine (PEPCID) 20 MG tablet Take 20 mg by mouth as needed.     fluticasone  (FLONASE ) 50 MCG/ACT nasal spray Place 2 sprays into both nostrils daily. 16 g 6   Galcanezumab -gnlm (EMGALITY ) 120 MG/ML SOAJ INJECT THE CONTENTS OF 1 PEN  SUBCUTANEOUSLY EVERY 28 DAYS 3 mL  0   LORazepam  (ATIVAN ) 0.5 MG tablet Take 1 tablet (0.5 mg total) by mouth continuous as needed for anxiety. 5 tablet 0   rizatriptan  (MAXALT ) 10 MG tablet Take 1 tablet (10 mg total) by mouth as needed for migraine. May repeat in 2 hours if needed 10 tablet 5   valACYclovir  (VALTREX ) 1000 MG tablet Take 1 tablet (1,000 mg total) by mouth 2 (two) times daily. 14 tablet 5   No current facility-administered medications on file prior to visit.    ALLERGIES: Allergies  Allergen Reactions   Hydrocodone Swelling   Topamax  [Topiramate ]     Cognitive slowing    FAMILY HISTORY: Family History  Problem Relation Age of Onset    BRCA 1/2 Mother        BRCA2 pos   Migraines Father    BRCA 1/2 Maternal Aunt        BRCA2 pos   Diabetes Paternal Uncle    Breast cancer Maternal Grandmother    Diabetes Maternal Grandfather    Depression Paternal Grandmother    Heart failure Paternal Grandmother    Crohn's disease Paternal Grandmother    CVA Paternal Grandmother    Other Paternal Grandmother        CREST    Sjogren's syndrome Paternal Grandmother    Sjogren's syndrome Paternal Great-grandmother    Heart attack Maternal Great-grandmother 67   Hypertension Maternal Great-grandmother    Bladder Cancer Maternal Great-grandmother    Breast cancer Other       Objective:  Blood pressure 114/83, pulse (!) 108, height 5' 6 (1.676 m), weight 224 lb (101.6 kg), SpO2 98%. General: No acute distress.  Patient appears well-groomed.   Head:  Normocephalic/atraumatic Neck:  Supple.  No paraspinal tenderness.  Full range of motion. Heart:  Regular rate and rhythm. Neuro:  Alert and oriented.  Speech fluent and not dysarthric.  Language intact.  CN II-XII intact.  Bulk and tone normal.  Muscle strength 5/5 throughout.  Sensation to light touch intact.  Deep tendon reflexes 2+ throughout, toes downgoing.  Gait normal.  Romberg negative.    Juliene Dunnings, DO  CC: Garnette Simpler, MD       "

## 2024-09-09 ENCOUNTER — Encounter: Payer: Self-pay | Admitting: Neurology

## 2024-09-09 ENCOUNTER — Ambulatory Visit: Payer: 59 | Admitting: Neurology

## 2024-09-09 VITALS — BP 114/83 | HR 108 | Ht 66.0 in | Wt 224.0 lb

## 2024-09-09 DIAGNOSIS — G43109 Migraine with aura, not intractable, without status migrainosus: Secondary | ICD-10-CM

## 2024-09-09 DIAGNOSIS — G4485 Primary stabbing headache: Secondary | ICD-10-CM

## 2024-09-09 MED ORDER — EMGALITY 120 MG/ML ~~LOC~~ SOAJ: SUBCUTANEOUS | 0 refills | Status: AC

## 2024-09-09 MED ORDER — RIZATRIPTAN BENZOATE 10 MG PO TABS: 10.0000 mg | ORAL_TABLET | ORAL | 5 refills | Status: AC | PRN

## 2024-09-09 NOTE — Patient Instructions (Signed)
 Emgality  Rizatriptan  as needed

## 2024-09-15 ENCOUNTER — Ambulatory Visit: Payer: 59 | Admitting: Obstetrics and Gynecology

## 2024-09-17 ENCOUNTER — Encounter: Payer: Self-pay | Admitting: Obstetrics and Gynecology

## 2024-09-17 ENCOUNTER — Ambulatory Visit (INDEPENDENT_AMBULATORY_CARE_PROVIDER_SITE_OTHER): Admitting: Obstetrics and Gynecology

## 2024-09-17 ENCOUNTER — Other Ambulatory Visit (HOSPITAL_COMMUNITY)
Admission: RE | Admit: 2024-09-17 | Discharge: 2024-09-17 | Disposition: A | Source: Ambulatory Visit | Attending: Obstetrics and Gynecology | Admitting: Obstetrics and Gynecology

## 2024-09-17 VITALS — BP 118/80 | HR 74 | Ht 66.25 in | Wt 220.0 lb

## 2024-09-17 DIAGNOSIS — N938 Other specified abnormal uterine and vaginal bleeding: Secondary | ICD-10-CM

## 2024-09-17 DIAGNOSIS — Z01419 Encounter for gynecological examination (general) (routine) without abnormal findings: Secondary | ICD-10-CM | POA: Insufficient documentation

## 2024-09-17 DIAGNOSIS — Z3009 Encounter for other general counseling and advice on contraception: Secondary | ICD-10-CM

## 2024-09-17 DIAGNOSIS — Z1331 Encounter for screening for depression: Secondary | ICD-10-CM

## 2024-09-17 DIAGNOSIS — Z1501 Genetic susceptibility to malignant neoplasm of breast: Secondary | ICD-10-CM | POA: Diagnosis not present

## 2024-09-17 DIAGNOSIS — N898 Other specified noninflammatory disorders of vagina: Secondary | ICD-10-CM | POA: Diagnosis not present

## 2024-09-17 MED ORDER — ETONOGESTREL 68 MG ~~LOC~~ IMPL
68.0000 mg | DRUG_IMPLANT | Freq: Once | SUBCUTANEOUS | Status: AC
  Administered 2024-10-02: 68 mg via SUBCUTANEOUS

## 2024-09-17 NOTE — Patient Instructions (Addendum)
 Hi Bethany Robinson, I have put in for the ca125 and the vaginal ultrasound to evaluate for the risks with brca 2 gene.  I have also put in the breast MRI for this year. If you have any difficulties scheduling or don't hear from radiology, let me know. I have placed a referral for dermatology as well. BRCA genes can have the risk for melanoma and should do annual skin checks. Dr. Glennon

## 2024-09-17 NOTE — Progress Notes (Signed)
 "   34 y.o. y.o. female here for annual exam. Patient's last menstrual period was 09/03/2024 (exact date). Menstrual Flow: Heavy Menstrual Control: Maxi pad Dysmenorrhea: (!) Severe Dysmenorrhea Symptoms: Cramping, Nausea, Diarrhea, Headache    G0P0000  Not interested in childbearing. Recent bilateral mastectomy with reconstructive surgery this past year.  Family lives in [place] Getting married soon.  RP: BrCa2 positive/Fam h/o Ovarian Ca for Pelvic US   HPI: Well on Nexplanon   x 01/20/22 .  Does report more irregular bleeding, since she is getting close to removal time.  She would like to remove soon and replace with a new one and get the abnormal bleeding out of the way before the wedding.  STI screen Negative 01/17/2022.   BrCa 2 Positive:  Ca 125 normal at 13.  [place] completed 10/02/24 ascus HPV+, colpo CIN1. Repeat pap smear today  Body mass index is 35.24 kg/m.     09/17/2024    7:56 AM 05/07/2024    8:22 AM 09/14/2023    9:29 AM  Depression screen PHQ 2/9  Decreased Interest 0 1 2  Down, Depressed, Hopeless 0 1 2  PHQ - 2 Score 0 2 4  Altered sleeping  3 1  Tired, decreased energy  3 3  Change in appetite  1 2  Feeling bad or failure about yourself   3 2  Trouble concentrating  2 1  Moving slowly or fidgety/restless  0 0  Suicidal thoughts  0 0  PHQ-9 Score  14  13   Difficult doing work/chores  Somewhat difficult Somewhat difficult     Data saved with a previous flowsheet row definition    Blood pressure 118/80, pulse 74, height 5' 6.25 (1.683 m), weight 220 lb (99.8 kg), last menstrual period 09/03/2024, SpO2 99%.     Component Value Date/Time   DIAGPAP  05/07/2024 0845    - Negative for Intraepithelial Lesions or Malignancy (NILM)   DIAGPAP - Benign reactive/reparative changes 05/07/2024 0845   DIAGPAP (A) 09/14/2023 1006    - Atypical squamous cells of undetermined significance (ASC-US )   HPVHIGH Negative 05/07/2024 0845   HPVHIGH Positive (A) 09/14/2023  1006   ADEQPAP  05/07/2024 0845    Satisfactory for evaluation; transformation zone component PRESENT.   ADEQPAP  09/14/2023 1006    Satisfactory for evaluation; transformation zone component ABSENT.   ADEQPAP  01/17/2022 1205    Satisfactory for evaluation; transformation zone component PRESENT.    GYN HISTORY:    Component Value Date/Time   DIAGPAP  05/07/2024 0845    - Negative for Intraepithelial Lesions or Malignancy (NILM)   DIAGPAP - Benign reactive/reparative changes 05/07/2024 0845   DIAGPAP (A) 09/14/2023 1006    - Atypical squamous cells of undetermined significance (ASC-US )   HPVHIGH Negative 05/07/2024 0845   HPVHIGH Positive (A) 09/14/2023 1006   ADEQPAP  05/07/2024 0845    Satisfactory for evaluation; transformation zone component PRESENT.   ADEQPAP  09/14/2023 1006    Satisfactory for evaluation; transformation zone component ABSENT.   ADEQPAP  01/17/2022 1205    Satisfactory for evaluation; transformation zone component PRESENT.    OB History  Gravida Para Term Preterm AB Living  0 0 0 0 0 0  SAB IAB Ectopic Multiple Live Births  0 0 0 0 0    Past Medical History:  Diagnosis Date   ADD (attention deficit disorder)    Allergy 08/28/1994   Seasonal   Anxiety    Asthma    Common migraine  with intractable migraine 09/27/2017   Depression    Family history of BRCA2 gene positive    Family history of breast cancer    GERD (gastroesophageal reflux disease)    HSV infection    oral    Past Surgical History:  Procedure Laterality Date   BREAST RECONSTRUCTION Bilateral    BREAST REDUCTION SURGERY Bilateral 03/28/2022   Procedure: MAMMARY REDUCTION  (BREAST);  Surgeon: Arelia Filippo, MD;  Location: [facility];  Service: Plastics;  Laterality: Bilateral;   NIPPLE SPARING MASTECTOMY Bilateral    TONSILLECTOMY     WISDOM TOOTH EXTRACTION      Medications Ordered Prior to Encounter[1]  Social History   Socioeconomic History   Marital  status: Single    Spouse name: Not on file   Number of children: 0   Years of education: 36   Highest education level: Master's degree (e.g., MA, MS, MEng, MEd, MSW, MBA)  Occupational History   Occupation: cookout   Occupation: Audiological Scientist    Comment: Radio Broadcast Assistant  Tobacco Use   Smoking status: Never   Smokeless tobacco: Never  Vaping Use   Vaping status: Never Used  Substance and Sexual Activity   Alcohol use: Yes    Comment: socially   Drug use: Not Currently   Sexual activity: Yes    Partners: Male    Birth control/protection: Implant  Other Topics Concern   Not on file  Social History Narrative   Right handed   Drinks caffeine   One floor home   Works at the [employer] out   Social Drivers of Health   Tobacco Use: Low Risk (09/17/2024)   Patient History    Smoking Tobacco Use: Never    Smokeless Tobacco Use: Never    Passive Exposure: Not on file  Financial Resource Strain: Low Risk (08/23/2023)   Overall Financial Resource Strain (CARDIA)    Difficulty of Paying Living Expenses: Not very hard  Food Insecurity: No Food Insecurity (08/23/2023)   Hunger Vital Sign    Worried About Running Out of Food in the Last Year: Never true    Ran Out of Food in the Last Year: Never true  Transportation Needs: No Transportation Needs (08/23/2023)   PRAPARE - Administrator, Civil Service (Medical): No    Lack of Transportation (Non-Medical): No  Physical Activity: Insufficiently Active (08/23/2023)   Exercise Vital Sign    Days of Exercise per Week: 1 day    Minutes of Exercise per Session: 10 min  Stress: Stress Concern Present (08/23/2023)   [employer] of Occupational Health - Occupational Stress Questionnaire    Feeling of Stress : To some extent  Social Connections: Socially Isolated (08/23/2023)   Social Connection and Isolation Panel    Frequency of Communication with Friends and Family: Once a week    Frequency of Social Gatherings with Friends and Family:  Never    Attends Religious Services: Never    Database Administrator or Organizations: No    Attends Engineer, Structural: Not on file    Marital Status: Never married  Intimate Partner Violence: Not At Risk (06/06/2023)   Received from [facility]   Interpersonal Safety Screen    Are you in a relationship where you have been physically or emotionally hurt or threatened? : No  Depression (PHQ2-9): Low Risk (09/17/2024)   Depression (PHQ2-9)    PHQ-2 Score: 0  Alcohol Screen: Low Risk (08/23/2023)   Alcohol Screen  Last Alcohol Screening Score (AUDIT): 2  Housing: Low Risk (08/23/2023)   Housing Stability Vital Sign    Unable to Pay for Housing in the Last Year: No    Number of Times Moved in the Last Year: 0    Homeless in the Last Year: No  Utilities: Not At Risk (06/08/2023)   Received from [facility]   [facility] Utilities    Threatened with loss of utilities: No  Health Literacy: Not on file    Family History  Problem Relation Age of Onset   BRCA 1/2 Mother        BRCA2 pos   Arthritis Mother    Migraines Father    Anxiety disorder Father    Depression Father    BRCA 1/2 Maternal Aunt        BRCA2 pos   Diabetes Paternal Uncle    Breast cancer Maternal Grandmother    Cancer Maternal Grandmother    Diabetes Maternal Grandfather    Hearing loss Maternal Grandfather    Depression Paternal Grandmother    Heart failure Paternal Grandmother    Crohn's disease Paternal Grandmother    CVA Paternal Grandmother    Other Paternal Grandmother        CREST    Sjogren's syndrome Paternal Grandmother    Heart disease Paternal Grandmother    Heart disease Paternal Grandfather    Vision loss Paternal Grandfather    Sjogren's syndrome Paternal Great-grandmother    Heart attack Maternal Great-grandmother 74   Hypertension Maternal Great-grandmother    Bladder Cancer Maternal Great-grandmother    Breast cancer Other    Anxiety disorder Sister    Anxiety  disorder Brother    Asthma Sister      Allergies[2]    Patient's last menstrual period was Patient's last menstrual period was 09/03/2024 (exact date)..             Review of Systems Alls systems reviewed and are negative.     Physical Exam Constitutional:      Appearance: Normal appearance.  Genitourinary:     Vulva and urethral meatus normal.     No lesions in the vagina.     Right Labia: No rash, lesions or skin changes.    Left Labia: No lesions, skin changes or rash.    No vaginal discharge or tenderness.     No vaginal prolapse present.    No vaginal atrophy present.     Right Adnexa: not tender, not palpable and no mass present.    Left Adnexa: not tender, not palpable and no mass present.    No cervical motion tenderness or discharge.     Uterus is not enlarged, tender or irregular.  Breasts:    Right: Normal.     Left: Normal.  HENT:     Head: Normocephalic.  Neck:     Thyroid: No thyroid mass, thyromegaly or thyroid tenderness.  Cardiovascular:     Rate and Rhythm: Normal rate and regular rhythm.     Heart sounds: Normal heart sounds, S1 normal and S2 normal.  Pulmonary:     Effort: Pulmonary effort is normal.     Breath sounds: Normal breath sounds and air entry.  Abdominal:     General: There is no distension.     Palpations: Abdomen is soft. There is no mass.     Tenderness: There is no abdominal tenderness. There is no guarding or rebound.  Musculoskeletal:        General: Normal  range of motion.     Cervical back: Full passive range of motion without pain, normal range of motion and neck supple. No tenderness.     Right lower leg: No edema.     Left lower leg: No edema.  Neurological:     Mental Status: She is alert.  Skin:    General: Skin is warm.  Psychiatric:        Mood and Affect: Mood normal.        Behavior: Behavior normal.        Thought Content: Thought content normal.  Vitals and nursing note reviewed. Exam conducted with a  chaperone present.       A:         Well Woman GYN exam BRCA2 gene pos                             P:        Pap smear collected today Encouraged annual mammogram screening Colon cancer screening not indicated Labs and immunizations ordered today Discussed breast self exams Encouraged healthy lifestyle practices Encouraged Vit D and Calcium Referral for derm for mole check, breast MRI and PUS yearly along with ca125  No follow-ups on file.  [name]    [1]  Current Outpatient Medications on File Prior to Visit  Medication Sig Dispense Refill   albuterol  (VENTOLIN  HFA) 108 (90 Base) MCG/ACT inhaler TAKE 2 PUFFS BY MOUTH EVERY 6 HOURS AS NEEDED FOR WHEEZE OR SHORTNESS OF BREATH 6.7 each 2   amphetamine-dextroamphetamine (ADDERALL) 10 MG tablet Take 10 mg by mouth 2 (two) times daily.     buPROPion (WELLBUTRIN SR) 150 MG 12 hr tablet Take 150 mg by mouth 2 (two) times daily.     Cetirizine HCl (ZYRTEC PO) Take 10 mg by mouth daily.      cloNIDine (CATAPRES) 0.1 MG tablet Take 0.1 mg by mouth daily.     doxepin  (SINEQUAN ) 25 MG capsule Take 100 mg by mouth daily.     etonogestrel  (NEXPLANON ) 68 MG IMPL implant 1 each by Subdermal route once.     famotidine (PEPCID) 20 MG tablet Take 20 mg by mouth as needed.     fluticasone  (FLONASE ) 50 MCG/ACT nasal spray Place 2 sprays into both nostrils daily. 16 g 6   Galcanezumab -gnlm (EMGALITY ) 120 MG/ML SOAJ INJECT THE CONTENTS OF 1 PEN  SUBCUTANEOUSLY EVERY 28 DAYS 3 mL 0   LORazepam  (ATIVAN ) 0.5 MG tablet Take 1 tablet (0.5 mg total) by mouth continuous as needed for anxiety. 5 tablet 0   rizatriptan  (MAXALT ) 10 MG tablet Take 1 tablet (10 mg total) by mouth as needed for migraine. May repeat in 2 hours if needed 10 tablet 5   valACYclovir  (VALTREX ) 1000 MG tablet Take 1 tablet (1,000 mg total) by mouth 2 (two) times daily. 14 tablet 5   No current facility-administered medications on file prior to visit.  [2]   Allergies Allergen Reactions   Hydrocodone Swelling   Topamax  [Topiramate ]     Cognitive slowing   "

## 2024-09-18 ENCOUNTER — Ambulatory Visit: Payer: Self-pay | Admitting: Obstetrics and Gynecology

## 2024-09-18 LAB — CBC
HCT: 44.7 % (ref 35.9–46.0)
Hemoglobin: 14.8 g/dL (ref 11.7–15.5)
MCH: 29.2 pg (ref 27.0–33.0)
MCHC: 33.1 g/dL (ref 31.6–35.4)
MCV: 88.3 fL (ref 81.4–101.7)
MPV: 12.6 fL — ABNORMAL HIGH (ref 7.5–12.5)
Platelets: 234 Thousand/uL (ref 140–400)
RBC: 5.06 Million/uL (ref 3.80–5.10)
RDW: 12.9 % (ref 11.0–15.0)
WBC: 5.5 Thousand/uL (ref 3.8–10.8)

## 2024-09-18 LAB — LIPID PANEL
Cholesterol: 148 mg/dL
HDL: 35 mg/dL — ABNORMAL LOW
LDL Cholesterol (Calc): 93 mg/dL
Non-HDL Cholesterol (Calc): 113 mg/dL
Total CHOL/HDL Ratio: 4.2 (calc)
Triglycerides: 102 mg/dL

## 2024-09-18 LAB — COMPREHENSIVE METABOLIC PANEL WITH GFR
AG Ratio: 1.9 (calc) (ref 1.0–2.5)
ALT: 16 U/L (ref 6–29)
AST: 17 U/L (ref 10–30)
Albumin: 4.6 g/dL (ref 3.6–5.1)
Alkaline phosphatase (APISO): 71 U/L (ref 31–125)
BUN: 14 mg/dL (ref 7–25)
CO2: 27 mmol/L (ref 20–32)
Calcium: 9.3 mg/dL (ref 8.6–10.2)
Chloride: 104 mmol/L (ref 98–110)
Creat: 0.92 mg/dL (ref 0.50–0.97)
Globulin: 2.4 g/dL (ref 1.9–3.7)
Glucose, Bld: 94 mg/dL (ref 65–99)
Potassium: 4.1 mmol/L (ref 3.5–5.3)
Sodium: 137 mmol/L (ref 135–146)
Total Bilirubin: 0.5 mg/dL (ref 0.2–1.2)
Total Protein: 7 g/dL (ref 6.1–8.1)
eGFR: 85 mL/min/1.73m2

## 2024-09-18 LAB — HEMOGLOBIN A1C
Hgb A1c MFr Bld: 5.4 %
Mean Plasma Glucose: 108 mg/dL
eAG (mmol/L): 6 mmol/L

## 2024-09-18 LAB — SURESWAB® ADVANCED VAGINITIS PLUS,TMA
C. trachomatis RNA, TMA: NOT DETECTED
CANDIDA SPECIES: NOT DETECTED
Candida glabrata: NOT DETECTED
N. gonorrhoeae RNA, TMA: NOT DETECTED
SURESWAB(R) ADV BACTERIAL VAGINOSIS(BV),TMA: NEGATIVE
TRICHOMONAS VAGINALIS (TV),TMA: NOT DETECTED

## 2024-09-18 LAB — CA 125: CA 125: 12 U/mL

## 2024-09-18 LAB — TSH: TSH: 2.28 m[IU]/L

## 2024-09-18 LAB — VITAMIN D 25 HYDROXY (VIT D DEFICIENCY, FRACTURES): Vit D, 25-Hydroxy: 32 ng/mL (ref 30–100)

## 2024-09-24 ENCOUNTER — Encounter: Payer: Self-pay | Admitting: Obstetrics and Gynecology

## 2024-09-24 LAB — CYTOLOGY - PAP
Comment: NEGATIVE
Diagnosis: NEGATIVE
High risk HPV: NEGATIVE

## 2024-09-24 NOTE — Telephone Encounter (Signed)
 Patient was seen for AEX 09/17/24, breast MRI ordered.

## 2024-10-02 ENCOUNTER — Encounter: Payer: Self-pay | Admitting: Obstetrics and Gynecology

## 2024-10-02 ENCOUNTER — Ambulatory Visit: Admitting: Obstetrics and Gynecology

## 2024-10-02 VITALS — BP 118/80 | HR 97 | Ht 66.0 in | Wt 220.0 lb

## 2024-10-02 DIAGNOSIS — Z3046 Encounter for surveillance of implantable subdermal contraceptive: Secondary | ICD-10-CM

## 2024-10-02 DIAGNOSIS — Z30017 Encounter for initial prescription of implantable subdermal contraceptive: Secondary | ICD-10-CM

## 2024-10-02 HISTORY — DX: Encounter for initial prescription of implantable subdermal contraceptive: Z30.017

## 2024-10-02 NOTE — Progress Notes (Signed)
 1% Lidocaine  NDC:  9590-5723-83 LOT: [id number] EXP: 10/10/24

## 2024-10-02 NOTE — Progress Notes (Signed)
 Pt reports bleeding off & on since last menses on 09/03/24

## 2024-10-02 NOTE — Progress Notes (Signed)
 Ms. Bethany Robinson is a G0P0000 female in the office today for removal of Nexplanon ; which was inserted 01/20/2022.  She has been having DUB and would like to have it removed and a new one placed.    BP 118/80 (BP Location: Left Arm, Patient Position: Sitting, Cuff Size: Normal)   Pulse 97   Ht 5' 6 (1.676 m)   Wt 220 lb (99.8 kg)   LMP 09/03/2024 (Exact Date)   SpO2 99%   BMI 35.51 kg/m    Procedure Note: Consent obtained and Time-Out conducted Implant palpated in back left upper arm Betadine prep done on area of excision/removal Lidocaine  infiltrated into intradermal and subcutaneous space Small 2mm incision made with scalpel Pressure applied to distal end of implant which exposed the tip through incision Tip of implant grasped with hemostat There was some adherence of implant in subcutaneous tissue.  Twisting and manipulation freed the implant from the capsule Implant removed and measured intact Pressure held on incision until bleeding stopped Steristrips applied to incision Pressure dressing applied by RN Patient tolerated procedure well.   Nexplanon  Insertion Procedure Patient identified, informed consent performed, consent signed.   Patient does understand that irregular bleeding is a very common side effect of this medication. She was advised to have backup contraception for one week after placement. Pregnancy test in clinic today was negative.  Appropriate time out taken.  Patient's left arm was prepped and draped in the usual sterile fashion. The area of insertion was noted on the left arm.  Patient was prepped with alcohol swab and then injected with 3 ml of 1% lidocaine .  She was prepped with betadine, Nexplanon  removed from packaging,  Device confirmed in needle, then inserted full length of needle and withdrawn per handbook instructions. Nexplanon  was able to palpated in the patient's arm; patient palpated the insert herself. There was minimal blood loss.  Patient insertion  site covered with guaze and a pressure bandage to reduce any bruising.  The patient tolerated the procedure well and was given post procedure instructions.   Assessment and Plan:  1. Encounter for Nexplanon  removal (Primary)  Nexplanon  insertion Post procedure instructions discussed with patient.    [name], MD  10/02/2024 3:06 PM

## 2024-10-02 NOTE — Patient Instructions (Signed)
 Nexplanon  Instructions After Insertion  Keep bandage clean and dry for 24 hours  May use ice/Tylenol/Ibuprofen for soreness or pain  If you develop fever, drainage or increased warmth from incision site-contact office immediately

## 2024-10-03 ENCOUNTER — Encounter: Payer: Self-pay | Admitting: Family Medicine

## 2024-10-03 DIAGNOSIS — B009 Herpesviral infection, unspecified: Secondary | ICD-10-CM

## 2024-10-03 MED ORDER — VALACYCLOVIR HCL 1 G PO TABS: 1000.0000 mg | ORAL_TABLET | Freq: Two times a day (BID) | ORAL | 5 refills | Status: AC

## 2024-10-03 NOTE — Telephone Encounter (Signed)
 Please review patient message and advise. Thanks. Dm/cma

## 2025-06-08 ENCOUNTER — Ambulatory Visit: Admitting: Physician Assistant

## 2025-09-10 ENCOUNTER — Ambulatory Visit: Payer: Self-pay | Admitting: Neurology
